# Patient Record
Sex: Female | Born: 1945 | Race: White | Hispanic: No | Marital: Single | Smoking: Current every day smoker
Health system: Southern US, Community
[De-identification: ages and names within clinical notes are randomized; demographics above are authoritative.]

## PROBLEM LIST (undated history)

## (undated) DIAGNOSIS — M1990 Unspecified osteoarthritis, unspecified site: Secondary | ICD-10-CM

## (undated) DIAGNOSIS — I499 Cardiac arrhythmia, unspecified: Secondary | ICD-10-CM

## (undated) DIAGNOSIS — M75102 Unspecified rotator cuff tear or rupture of left shoulder, not specified as traumatic: Secondary | ICD-10-CM

## (undated) DIAGNOSIS — F32A Depression, unspecified: Secondary | ICD-10-CM

## (undated) DIAGNOSIS — R0600 Dyspnea, unspecified: Secondary | ICD-10-CM

## (undated) DIAGNOSIS — I2510 Atherosclerotic heart disease of native coronary artery without angina pectoris: Secondary | ICD-10-CM

## (undated) DIAGNOSIS — K219 Gastro-esophageal reflux disease without esophagitis: Secondary | ICD-10-CM

## (undated) DIAGNOSIS — F329 Major depressive disorder, single episode, unspecified: Secondary | ICD-10-CM

## (undated) DIAGNOSIS — R011 Cardiac murmur, unspecified: Secondary | ICD-10-CM

## (undated) DIAGNOSIS — J449 Chronic obstructive pulmonary disease, unspecified: Secondary | ICD-10-CM

## (undated) DIAGNOSIS — F419 Anxiety disorder, unspecified: Secondary | ICD-10-CM

## (undated) DIAGNOSIS — E119 Type 2 diabetes mellitus without complications: Secondary | ICD-10-CM

## (undated) HISTORY — PX: EYE SURGERY: SHX253

## (undated) HISTORY — PX: OTHER SURGICAL HISTORY: SHX169

## (undated) HISTORY — PX: ABDOMINAL HYSTERECTOMY: SHX81

## (undated) HISTORY — PX: HERNIA REPAIR: SHX51

---

## 2000-12-05 HISTORY — PX: CORONARY ARTERY BYPASS GRAFT: SHX141

## 2006-08-11 DIAGNOSIS — E119 Type 2 diabetes mellitus without complications: Secondary | ICD-10-CM | POA: Insufficient documentation

## 2006-08-11 DIAGNOSIS — I2510 Atherosclerotic heart disease of native coronary artery without angina pectoris: Secondary | ICD-10-CM | POA: Insufficient documentation

## 2006-08-11 DIAGNOSIS — E7800 Pure hypercholesterolemia, unspecified: Secondary | ICD-10-CM | POA: Insufficient documentation

## 2006-08-24 DIAGNOSIS — E559 Vitamin D deficiency, unspecified: Secondary | ICD-10-CM | POA: Insufficient documentation

## 2016-11-21 ENCOUNTER — Ambulatory Visit (INDEPENDENT_AMBULATORY_CARE_PROVIDER_SITE_OTHER): Payer: Self-pay | Admitting: Orthopaedic Surgery

## 2017-09-11 ENCOUNTER — Ambulatory Visit (INDEPENDENT_AMBULATORY_CARE_PROVIDER_SITE_OTHER): Payer: Self-pay | Admitting: Orthopaedic Surgery

## 2017-09-11 ENCOUNTER — Ambulatory Visit (INDEPENDENT_AMBULATORY_CARE_PROVIDER_SITE_OTHER): Payer: Medicare Other | Admitting: Orthopaedic Surgery

## 2017-09-11 ENCOUNTER — Ambulatory Visit (INDEPENDENT_AMBULATORY_CARE_PROVIDER_SITE_OTHER): Payer: Medicare Other

## 2017-09-11 DIAGNOSIS — M25551 Pain in right hip: Secondary | ICD-10-CM | POA: Diagnosis not present

## 2017-09-11 DIAGNOSIS — M1611 Unilateral primary osteoarthritis, right hip: Secondary | ICD-10-CM

## 2017-09-11 NOTE — Progress Notes (Signed)
Office Visit Note   Patient: Becky Douglas           Date of Birth: May 22, 1946           MRN: [id number] Visit Date: 09/11/2017              Requested by: No referring provider defined for this encounter. PCP: No primary care provider on file.   Assessment & Plan: Visit Diagnoses:  1. Pain in right hip   2. Unilateral primary osteoarthritis, right hip     Plan: She does have severe end-stage arthritis of her right hip and is felt conservative treatment for over a year now. She does wish proceed with total hip arthroplasty. I explained in detail what this surgery involves. Her daughter is with her and I have actually performed a hip replacement on her right hip a year and half ago and she did excellent with this. Her mother's pain is daily at this point is severe enough and this correlates with x-ray showing severe end-stage arthritis of her right hip. We will work on getting a total hip arthroplasty set up in the near future. We long and thorough discussion about what the surgery involves including a discussion of the risk and benefits of the surgery. We talked about her intraoperative and postoperative courses well. All questions and concerns were answered and addressed. We'll work on setting this surgery up in the near future.  Follow-Up Instructions: Return for 2 weeks post-op.   Orders:  Orders Placed This Encounter  Procedures  . XR HIP UNILAT W OR W/O PELVIS 1V RIGHT   No orders of the defined types were placed in this encounter.     Procedures: No procedures performed   Clinical Data: No additional findings.   Subjective: No chief complaint on file. The patient is a very pleasant 71 year old female with debilitating right hip pain. This pain is been worsening for over a year now. It hurts mainly in the groin. It hurts with weightbearing activities. He can be 10 out of 10 at times. At this point it is detrimentally affected her activities daily living, her quality of  life, and her mobility. She has tried and failed rest, ice, heat, anti-inflammatories, weight loss, walking with assistive device, and a home exercise program. She is work on hip strengthening exercises well. At this point her pain is severe enough that she is seeking treatment for this. She has tried Motrin 800 mg as well. She does have a history of open heart surgery but is been seen by her cardiologist in the past who tells her everything looks good and have her normal annual follow-ups. She has follow up soon. She is a diabetic but reports excellent control of her blood glucose.  HPI  Review of Systems She currently denies any headache, chest pain, shortness of breath, fever, chills, nausea, vomiting.  Objective: Vital Signs: There were no vitals taken for this visit.  Physical Exam She is alert and oriented 3 and in no acute distress. Ortho Exam Examination of her left non-painful hip is normal. Examination of her right hip shows limitations with internal and external rotation with severe pain with attempts of this rotation. Specialty Comments:  No specialty comments available.  Imaging: Xr Hip Unilat W Or W/o Pelvis 1v Right  Result Date: 09/11/2017 An AP pelvis and lateral of her right hip show severe end-stage arthritis. There is complete loss of the joint space of the right hip with periarticular osteophytes, sclerotic changes and  cystic changes in the femoral head.    PMFS History: Patient Active Problem List   Diagnosis Date Noted  . Pain in right hip 09/11/2017  . Unilateral primary osteoarthritis, right hip 09/11/2017   No past medical history on file.  No family history on file.  No past surgical history on file. Social History   Occupational History  . Not on file.   Social History Main Topics  . Smoking status: Not on file  . Smokeless tobacco: Not on file  . Alcohol use Not on file  . Drug use: Unknown  . Sexual activity: Not on file

## 2017-09-20 NOTE — Progress Notes (Signed)
Please place orders in EPIC as patient is being scheduled for a pre-op appointment! Thank you! 

## 2017-09-25 NOTE — Progress Notes (Signed)
Please place orders in EPIC as patient has a pre-op appointment on 10/02/2017! Thank you! 

## 2017-09-27 NOTE — Progress Notes (Signed)
Need orders in epic.  Surgery on 10/06/17.  preop on 10/02/17.

## 2017-09-28 NOTE — Patient Instructions (Addendum)
Becky Douglas  09/28/2017   Your procedure is scheduled on: 10/06/2017    Report to [address] [facility] Main  Entrance   Report to admitting at   1030 AM   Call this number if you have problems the morning of surgery  [phone]   Remember: ONLY 1 PERSON MAY GO WITH YOU TO SHORT STAY TO GET  READY MORNING OF YOUR SURGERY.  Do not eat food or drink liquids :After Midnight.     Take these medicines the morning of surgery with A SIP OF WATER: Atenolol ( Tenormin0, Inhalers as usual and bring, paxil  DO NOT TAKE ANY DIABETIC MEDICATIONS DAY OF YOUR SURGERY                               You may not have any metal on your body including hair pins and              piercings  Do not wear jewelry, make-up, lotions, powders or perfumes, deodorant             Do not wear nail polish.  Do not shave  48 hours prior to surgery.                Do not bring valuables to the hospital. [facility] IS NOT             RESPONSIBLE   FOR VALUABLES.  Contacts, dentures or bridgework may not be worn into surgery.  Leave suitcase in the car. After surgery it may be brought to your room.                     Please read over the following fact sheets you were given: _____________________________________________________________________                CLEAR LIQUID DIET   Foods Allowed                                                                     Foods Excluded  Coffee and tea, regular and decaf                             liquids that you cannot  Plain Jell-O in any flavor                                             see through such as: Fruit ices (not with fruit pulp)                                     milk, soups, orange juice  Iced Popsicles                                    All solid food Carbonated beverages, regular  and diet                                    Cranberry, grape and apple juices Sports drinks like Gatorade Lightly seasoned clear broth or  consume(fat free) Sugar, honey syrup  Sample Menu Breakfast                                Lunch                                     Supper Cranberry juice                    Beef broth                            Chicken broth Jell-O                                     Grape juice                           Apple juice Coffee or tea                        Jell-O                                      Popsicle                                                Coffee or tea                        Coffee or tea  _____________________________________________________________________  [facility] - Preparing for Surgery Before surgery, you can play an important role.  Because skin is not sterile, your skin needs to be as free of germs as possible.  You can reduce the number of germs on your skin by washing with CHG (chlorahexidine gluconate) soap before surgery.  CHG is an antiseptic cleaner which kills germs and bonds with the skin to continue killing germs even after washing. Please DO NOT use if you have an allergy to CHG or antibacterial soaps.  If your skin becomes reddened/irritated stop using the CHG and inform your nurse when you arrive at Short Stay. Do not shave (including legs and underarms) for at least 48 hours prior to the first CHG shower.  You may shave your face/neck. Please follow these instructions carefully:  1.  Shower with CHG Soap the night before surgery and the  morning of Surgery.  2.  If you choose to wash your hair, wash your hair first as usual with your  normal  shampoo.  3.  After you shampoo, rinse your hair and body thoroughly to remove the  shampoo.  4.  Use CHG as you would any other liquid soap.  You can apply chg directly  to the skin and wash                       Gently with a scrungie or clean washcloth.  5.  Apply the CHG Soap to your body ONLY FROM THE NECK DOWN.   Do not use on face/ open                           Wound or open sores.  Avoid contact with eyes, ears mouth and genitals (private parts).                       Wash face,  Genitals (private parts) with your normal soap.             6.  Wash thoroughly, paying special attention to the area where your surgery  will be performed.  7.  Thoroughly rinse your body with warm water from the neck down.  8.  DO NOT shower/wash with your normal soap after using and rinsing off  the CHG Soap.                9.  Pat yourself dry with a clean towel.            10.  Wear clean pajamas.            11.  Place clean sheets on your bed the night of your first shower and do not  sleep with pets. Day of Surgery : Do not apply any lotions/deodorants the morning of surgery.  Please wear clean clothes to the hospital/surgery center.  FAILURE TO FOLLOW THESE INSTRUCTIONS MAY RESULT IN THE CANCELLATION OF YOUR SURGERY PATIENT SIGNATURE_________________________________  NURSE SIGNATURE__________________________________  ________________________________________________________________________  WHAT IS A BLOOD TRANSFUSION? Blood Transfusion Information  A transfusion is the replacement of blood or some of its parts. Blood is made up of multiple cells which provide different functions.  Red blood cells carry oxygen and are used for blood loss replacement.  White blood cells fight against infection.  Platelets control bleeding.  Plasma helps clot blood.  Other blood products are available for specialized needs, such as hemophilia or other clotting disorders. BEFORE THE TRANSFUSION  Who gives blood for transfusions?   Healthy volunteers who are fully evaluated to make sure their blood is safe. This is blood bank blood. Transfusion therapy is the safest it has ever been in the practice of medicine. Before blood is taken from a donor, a complete history is taken to make sure that person has no history of diseases nor engages in risky social behavior (examples are intravenous drug use  or sexual activity with multiple partners). The donor's travel history is screened to minimize risk of transmitting infections, such as malaria. The donated blood is tested for signs of infectious diseases, such as HIV and hepatitis. The blood is then tested to be sure it is compatible with you in order to minimize the chance of a transfusion reaction. If you or a relative donates blood, this is often done in anticipation of surgery and is not appropriate for emergency situations. It takes many days to process the donated blood. RISKS AND COMPLICATIONS Although transfusion therapy is very safe and saves many lives, the main dangers of transfusion include:   Getting an infectious disease.  Developing a transfusion reaction.  This is an allergic reaction to something in the blood you were given. Every precaution is taken to prevent this. The decision to have a blood transfusion has been considered carefully by your caregiver before blood is given. Blood is not given unless the benefits outweigh the risks. AFTER THE TRANSFUSION  Right after receiving a blood transfusion, you will usually feel much better and more energetic. This is especially true if your red blood cells have gotten low (anemic). The transfusion raises the level of the red blood cells which carry oxygen, and this usually causes an energy increase.  The nurse administering the transfusion will monitor you carefully for complications. HOME CARE INSTRUCTIONS  No special instructions are needed after a transfusion. You may find your energy is better. Speak with your caregiver about any limitations on activity for underlying diseases you may have. SEEK MEDICAL CARE IF:   Your condition is not improving after your transfusion.  You develop redness or irritation at the intravenous (IV) site. SEEK IMMEDIATE MEDICAL CARE IF:  Any of the following symptoms occur over the next 12 hours:  Shaking chills.  You have a temperature by mouth  above 102 F (38.9 C), not controlled by medicine.  Chest, back, or muscle pain.  People around you feel you are not acting correctly or are confused.  Shortness of breath or difficulty breathing.  Dizziness and fainting.  You get a rash or develop hives.  You have a decrease in urine output.  Your urine turns a dark color or changes to pink, red, or brown. Any of the following symptoms occur over the next 10 days:  You have a temperature by mouth above 102 F (38.9 C), not controlled by medicine.  Shortness of breath.  Weakness after normal activity.  The white part of the eye turns yellow (jaundice).  You have a decrease in the amount of urine or are urinating less often.  Your urine turns a dark color or changes to pink, red, or brown. Document Released: 11/18/2000 Document Revised: 02/13/2012 Document Reviewed: 07/07/2008 ExitCare Patient Information 2014 [place], [place].  _______________________________________________________________________  Incentive Spirometer  An incentive spirometer is a tool that can help keep your lungs clear and active. This tool measures how well you are filling your lungs with each breath. Taking long deep breaths may help reverse or decrease the chance of developing breathing (pulmonary) problems (especially infection) following:  A long period of time when you are unable to move or be active. BEFORE THE PROCEDURE   If the spirometer includes an indicator to show your best effort, your nurse or respiratory therapist will set it to a desired goal.  If possible, sit up straight or lean slightly forward. Try not to slouch.  Hold the incentive spirometer in an upright position. INSTRUCTIONS FOR USE  1. Sit on the edge of your bed if possible, or sit up as far as you can in bed or on a chair. 2. Hold the incentive spirometer in an upright position. 3. Breathe out normally. 4. Place the mouthpiece in your mouth and seal your lips tightly  around it. 5. Breathe in slowly and as deeply as possible, raising the piston or the ball toward the top of the column. 6. Hold your breath for 3-5 seconds or for as long as possible. Allow the piston or ball to fall to the bottom of the column. 7. Remove the mouthpiece from your mouth and breathe out normally. 8. Rest for a few seconds and repeat Steps  1 through 7 at least 10 times every 1-2 hours when you are awake. Take your time and take a few normal breaths between deep breaths. 9. The spirometer may include an indicator to show your best effort. Use the indicator as a goal to work toward during each repetition. 10. After each set of 10 deep breaths, practice coughing to be sure your lungs are clear. If you have an incision (the cut made at the time of surgery), support your incision when coughing by placing a pillow or rolled up towels firmly against it. Once you are able to get out of bed, walk around indoors and cough well. You may stop using the incentive spirometer when instructed by your caregiver.  RISKS AND COMPLICATIONS  Take your time so you do not get dizzy or light-headed.  If you are in pain, you may need to take or ask for pain medication before doing incentive spirometry. It is harder to take a deep breath if you are having pain. AFTER USE  Rest and breathe slowly and easily.  It can be helpful to keep track of a log of your progress. Your caregiver can provide you with a simple table to help with this. If you are using the spirometer at home, follow these instructions: [place] IF:   You are having difficultly using the spirometer.  You have trouble using the spirometer as often as instructed.  Your pain medication is not giving enough relief while using the spirometer.  You develop fever of 100.5 F (38.1 C) or higher. SEEK IMMEDIATE MEDICAL CARE IF:   You cough up bloody sputum that had not been present before.  You develop fever of 102 F (38.9 C) or  greater.  You develop worsening pain at or near the incision site. MAKE SURE YOU:   Understand these instructions.  Will watch your condition.  Will get help right away if you are not doing well or get worse. Document Released: 04/03/2007 Document Revised: 02/13/2012 Document Reviewed: 06/04/2007 [facility] Patient Information 2014 [place], [place].   ________________________________________________________________________

## 2017-09-28 NOTE — Progress Notes (Signed)
Please place orders in EPIC as patient has a pre-op appointment on 10/02/2017! Thank you! 

## 2017-09-29 ENCOUNTER — Other Ambulatory Visit (INDEPENDENT_AMBULATORY_CARE_PROVIDER_SITE_OTHER): Payer: Self-pay | Admitting: Physician Assistant

## 2017-09-29 NOTE — Progress Notes (Signed)
Please place orders in EPIC as patient has a pre-op appointment on 10/02/2017! Thank you! 

## 2017-10-02 ENCOUNTER — Encounter (HOSPITAL_COMMUNITY): Payer: Self-pay | Admitting: *Deleted

## 2017-10-02 ENCOUNTER — Encounter (HOSPITAL_COMMUNITY)
Admission: RE | Admit: 2017-10-02 | Discharge: 2017-10-02 | Disposition: A | Discharge status: Home / Self Care | Payer: Medicare Other | Source: Ambulatory Visit | Attending: Orthopaedic Surgery | Admitting: Orthopaedic Surgery

## 2017-10-02 DIAGNOSIS — Z01818 Encounter for other preprocedural examination: Secondary | ICD-10-CM | POA: Insufficient documentation

## 2017-10-02 DIAGNOSIS — M1611 Unilateral primary osteoarthritis, right hip: Secondary | ICD-10-CM | POA: Diagnosis not present

## 2017-10-02 HISTORY — DX: Gastro-esophageal reflux disease without esophagitis: K21.9

## 2017-10-02 HISTORY — DX: Chronic obstructive pulmonary disease, unspecified: J44.9

## 2017-10-02 HISTORY — DX: Anxiety disorder, unspecified: F41.9

## 2017-10-02 HISTORY — DX: Cardiac arrhythmia, unspecified: I49.9

## 2017-10-02 HISTORY — DX: Dyspnea, unspecified: R06.00

## 2017-10-02 HISTORY — DX: Depression, unspecified: F32.A

## 2017-10-02 HISTORY — DX: Type 2 diabetes mellitus without complications: E11.9

## 2017-10-02 HISTORY — DX: Unspecified osteoarthritis, unspecified site: M19.90

## 2017-10-02 HISTORY — DX: Unspecified rotator cuff tear or rupture of left shoulder, not specified as traumatic: M75.102

## 2017-10-02 HISTORY — DX: Atherosclerotic heart disease of native coronary artery without angina pectoris: I25.10

## 2017-10-02 HISTORY — DX: Major depressive disorder, single episode, unspecified: F32.9

## 2017-10-02 HISTORY — DX: Cardiac murmur, unspecified: R01.1

## 2017-10-02 LAB — BASIC METABOLIC PANEL
ANION GAP: 8 (ref 5–15)
BUN: 14 mg/dL (ref 6–20)
CHLORIDE: 104 mmol/L (ref 101–111)
CO2: 29 mmol/L (ref 22–32)
Calcium: 9.4 mg/dL (ref 8.9–10.3)
Creatinine, Ser: 0.94 mg/dL (ref 0.44–1.00)
GFR calc Af Amer: >60 mL/min (ref >60)
GFR calc non Af Amer: >60 mL/min (ref >60)
GLUCOSE: 76 mg/dL (ref 65–99)
POTASSIUM: 4.8 mmol/L (ref 3.5–5.1)
Sodium: 141 mmol/L (ref 135–145)

## 2017-10-02 LAB — HEMOGLOBIN A1C
HEMOGLOBIN A1C: 7.4 % — AB (ref 4.8–5.6)
Mean Plasma Glucose: 165.68 mg/dL

## 2017-10-02 LAB — SURGICAL PCR SCREEN
MRSA, PCR: NEGATIVE
STAPHYLOCOCCUS AUREUS: NEGATIVE

## 2017-10-02 LAB — CBC
HEMATOCRIT: 40.6 % (ref 36.0–46.0)
Hemoglobin: 12.3 g/dL (ref 12.0–15.0)
MCH: 27 pg (ref 26.0–34.0)
MCHC: 30.3 g/dL (ref 30.0–36.0)
MCV: 89.2 fL (ref 78.0–100.0)
PLATELETS: 423 10*3/uL — AB (ref 150–400)
RBC: 4.55 MIL/uL (ref 3.87–5.11)
RDW: 14.5 % (ref 11.5–15.5)
WBC: 12.3 10*3/uL — AB (ref 4.0–10.5)

## 2017-10-02 LAB — GLUCOSE, CAPILLARY: GLUCOSE-CAPILLARY: 80 mg/dL (ref 65–99)

## 2017-10-02 LAB — ABO/RH: ABO/RH(D): A POS

## 2017-10-02 NOTE — Progress Notes (Signed)
10/11/16-Dr Sheliah HatchWarner- cardiology- LOV- epic  ECHO-2015 on chart  10/11/16-EKG on chart

## 2017-10-02 NOTE — Progress Notes (Signed)
CBC done 10/02/17 routed via epic to Dr Allie Bossierhris Blackman.

## 2017-10-03 NOTE — Progress Notes (Signed)
Final EKG done 10/02/17-epic  

## 2017-10-05 NOTE — Progress Notes (Signed)
Requesting any clearances you may have received from pulmonary ( Dr Beatrix Fettersavid Smith0  and/or cardiology ( Dr Sheliah HatchWarner)  on this patient. Thank You.

## 2017-10-05 NOTE — Progress Notes (Signed)
DR Michelle Piperssey  ( anesthesia) made aware of patient history along with LOV note from 10/03/17- pulmonary on chart Ct chest( 10/8/180 on chart and history of cabg.  Dr Michelle Piperssey reviewed and order given to call Felicie Mornavid Smith PA and to inform that patient having right hip on 10/06/17 which I did and that surgery would be under spinal and for him to send over any recommendations he would have.  Called office of Felicie Mornavid Smith, [place] and spoke with Leandro Reasoneroxanne and she will give message to Felicie Mornavid Smith and either have him call me or fax over any recommendations for surgery.

## 2017-10-05 NOTE — Progress Notes (Signed)
Roxanne called back from office of Felicie MornDavid Smith ( pulmonology) and stated per Felicie Mornavid Smith, PA that other than patient needs oxygen 24/7 there are no other recommendations since patient is receiving spinal for surgery.

## 2017-10-05 NOTE — Progress Notes (Signed)
Requested LOV note from 10/03/2017 since was not completed on 10/04/17 from pulmonology.  They are to fax.

## 2017-10-05 NOTE — Progress Notes (Signed)
Rerequested LOV note from Dr Felicie Mornavid Smith ( pulm). Office to send.

## 2017-10-05 NOTE — Progress Notes (Signed)
CT of chest done 09/12/2017 on chart.

## 2017-10-06 ENCOUNTER — Encounter (HOSPITAL_COMMUNITY): Payer: Self-pay

## 2017-10-06 ENCOUNTER — Encounter (HOSPITAL_COMMUNITY)
Admission: RE | Disposition: A | Discharge status: Skilled Nursing Facility | Payer: Self-pay | Source: Ambulatory Visit | Attending: Orthopaedic Surgery

## 2017-10-06 ENCOUNTER — Inpatient Hospital Stay (HOSPITAL_COMMUNITY)
Admission: RE | Admit: 2017-10-06 | Discharge: 2017-10-09 | DRG: 470 | Disposition: A | Discharge status: Skilled Nursing Facility | Payer: Medicare Other | Source: Ambulatory Visit | Attending: Orthopaedic Surgery | Admitting: Orthopaedic Surgery

## 2017-10-06 ENCOUNTER — Inpatient Hospital Stay (HOSPITAL_COMMUNITY): Payer: Medicare Other | Admitting: Certified Registered Nurse Anesthetist

## 2017-10-06 ENCOUNTER — Inpatient Hospital Stay (HOSPITAL_COMMUNITY): Payer: Medicare Other

## 2017-10-06 ENCOUNTER — Telehealth (HOSPITAL_COMMUNITY): Payer: Self-pay | Admitting: *Deleted

## 2017-10-06 DIAGNOSIS — J449 Chronic obstructive pulmonary disease, unspecified: Secondary | ICD-10-CM | POA: Diagnosis present

## 2017-10-06 DIAGNOSIS — R410 Disorientation, unspecified: Secondary | ICD-10-CM | POA: Diagnosis present

## 2017-10-06 DIAGNOSIS — M1611 Unilateral primary osteoarthritis, right hip: Principal | ICD-10-CM | POA: Diagnosis present

## 2017-10-06 DIAGNOSIS — Z951 Presence of aortocoronary bypass graft: Secondary | ICD-10-CM | POA: Diagnosis not present

## 2017-10-06 DIAGNOSIS — K219 Gastro-esophageal reflux disease without esophagitis: Secondary | ICD-10-CM | POA: Diagnosis present

## 2017-10-06 DIAGNOSIS — E669 Obesity, unspecified: Secondary | ICD-10-CM | POA: Diagnosis present

## 2017-10-06 DIAGNOSIS — I2510 Atherosclerotic heart disease of native coronary artery without angina pectoris: Secondary | ICD-10-CM | POA: Diagnosis present

## 2017-10-06 DIAGNOSIS — Z419 Encounter for procedure for purposes other than remedying health state, unspecified: Secondary | ICD-10-CM

## 2017-10-06 DIAGNOSIS — F17210 Nicotine dependence, cigarettes, uncomplicated: Secondary | ICD-10-CM | POA: Diagnosis present

## 2017-10-06 DIAGNOSIS — Z6835 Body mass index (BMI) 35.0-35.9, adult: Secondary | ICD-10-CM | POA: Diagnosis not present

## 2017-10-06 DIAGNOSIS — Z96641 Presence of right artificial hip joint: Secondary | ICD-10-CM

## 2017-10-06 DIAGNOSIS — E119 Type 2 diabetes mellitus without complications: Secondary | ICD-10-CM | POA: Diagnosis present

## 2017-10-06 HISTORY — PX: TOTAL HIP ARTHROPLASTY: SHX124

## 2017-10-06 LAB — GLUCOSE, CAPILLARY
GLUCOSE-CAPILLARY: 250 mg/dL — AB (ref 65–99)
Glucose-Capillary: 149 mg/dL — ABNORMAL HIGH (ref 65–99)
Glucose-Capillary: 90 mg/dL (ref 65–99)
Glucose-Capillary: 313 mg/dL — ABNORMAL HIGH (ref 65–99)

## 2017-10-06 LAB — TYPE AND SCREEN
ABO/RH(D): A POS
Antibody Screen: NEGATIVE

## 2017-10-06 SURGERY — ARTHROPLASTY, HIP, TOTAL, ANTERIOR APPROACH
Anesthesia: Spinal | Site: Hip | Laterality: Right

## 2017-10-06 MED ORDER — ALBUTEROL SULFATE (2.5 MG/3ML) 0.083% IN NEBU
2.5000 mg | INHALATION_SOLUTION | Freq: Once | RESPIRATORY_TRACT | Status: AC
  Administered 2017-10-06: 2.5 mg via RESPIRATORY_TRACT

## 2017-10-06 MED ORDER — ALPRAZOLAM 1 MG PO TABS
1.0000 mg | ORAL_TABLET | Freq: Every day | ORAL | Status: DC
  Administered 2017-10-06 – 2017-10-08 (×2): 1 mg via ORAL
  Filled 2017-10-06 (×2): qty 1

## 2017-10-06 MED ORDER — CEFAZOLIN SODIUM-DEXTROSE 2-4 GM/100ML-% IV SOLN
2.0000 g | INTRAVENOUS | Status: AC
  Administered 2017-10-06: 2 g via INTRAVENOUS

## 2017-10-06 MED ORDER — BENAZEPRIL HCL 10 MG PO TABS
10.0000 mg | ORAL_TABLET | Freq: Every day | ORAL | Status: DC
  Administered 2017-10-07 – 2017-10-09 (×3): 10 mg via ORAL
  Filled 2017-10-06 (×3): qty 1

## 2017-10-06 MED ORDER — PAROXETINE HCL 10 MG PO TABS
10.0000 mg | ORAL_TABLET | Freq: Every day | ORAL | Status: DC
  Administered 2017-10-06 – 2017-10-09 (×4): 10 mg via ORAL
  Filled 2017-10-06 (×4): qty 1

## 2017-10-06 MED ORDER — HYDROMORPHONE HCL 1 MG/ML IJ SOLN
1.0000 mg | INTRAMUSCULAR | Status: DC | PRN
  Administered 2017-10-06 – 2017-10-07 (×2): 1 mg via INTRAVENOUS
  Filled 2017-10-06: qty 1

## 2017-10-06 MED ORDER — CEFAZOLIN SODIUM-DEXTROSE 2-4 GM/100ML-% IV SOLN
INTRAVENOUS | Status: AC
  Filled 2017-10-06: qty 100

## 2017-10-06 MED ORDER — UMECLIDINIUM BROMIDE 62.5 MCG/INH IN AEPB
1.0000 | INHALATION_SPRAY | Freq: Every day | RESPIRATORY_TRACT | Status: DC
  Administered 2017-10-07 – 2017-10-09 (×3): 1 via RESPIRATORY_TRACT
  Filled 2017-10-06: qty 7

## 2017-10-06 MED ORDER — ACETAMINOPHEN 325 MG PO TABS
650.0000 mg | ORAL_TABLET | ORAL | Status: DC | PRN
Start: 2017-10-06 — End: 2017-10-09
  Administered 2017-10-08 – 2017-10-09 (×2): 650 mg via ORAL
  Filled 2017-10-06 (×2): qty 2

## 2017-10-06 MED ORDER — MIDAZOLAM HCL 2 MG/2ML IJ SOLN
INTRAMUSCULAR | Status: AC
  Filled 2017-10-06: qty 2

## 2017-10-06 MED ORDER — METFORMIN HCL 500 MG PO TABS
1000.0000 mg | ORAL_TABLET | Freq: Two times a day (BID) | ORAL | Status: DC
  Administered 2017-10-06 – 2017-10-09 (×6): 1000 mg via ORAL
  Filled 2017-10-06 (×6): qty 2

## 2017-10-06 MED ORDER — TRANEXAMIC ACID 1000 MG/10ML IV SOLN
1000.0000 mg | INTRAVENOUS | Status: AC
  Administered 2017-10-06: 1000 mg via INTRAVENOUS
  Filled 2017-10-06: qty 1100

## 2017-10-06 MED ORDER — GABAPENTIN 100 MG PO CAPS
100.0000 mg | ORAL_CAPSULE | Freq: Three times a day (TID) | ORAL | Status: DC
  Administered 2017-10-06 – 2017-10-09 (×9): 100 mg via ORAL
  Filled 2017-10-06 (×9): qty 1

## 2017-10-06 MED ORDER — ALUM & MAG HYDROXIDE-SIMETH 200-200-20 MG/5ML PO SUSP: 30.0000 mL | ORAL | Status: DC | PRN

## 2017-10-06 MED ORDER — FENTANYL CITRATE (PF) 100 MCG/2ML IJ SOLN
INTRAMUSCULAR | Status: DC | PRN
  Administered 2017-10-06: 50 ug via INTRAVENOUS

## 2017-10-06 MED ORDER — MENTHOL 3 MG MT LOZG
1.0000 | LOZENGE | OROMUCOSAL | Status: DC | PRN
Start: 2017-10-06 — End: 2017-10-09

## 2017-10-06 MED ORDER — PROPOFOL 10 MG/ML IV BOLUS
INTRAVENOUS | Status: AC
  Filled 2017-10-06: qty 60

## 2017-10-06 MED ORDER — FLUTICASONE-UMECLIDIN-VILANT 100-62.5-25 MCG/INH IN AEPB: 1.0000 | INHALATION_SPRAY | Freq: Every day | RESPIRATORY_TRACT | Status: DC

## 2017-10-06 MED ORDER — DIPHENHYDRAMINE HCL 12.5 MG/5ML PO ELIX: 12.5000 mg | ORAL_SOLUTION | ORAL | Status: DC | PRN

## 2017-10-06 MED ORDER — PROMETHAZINE HCL 25 MG/ML IJ SOLN: 6.2500 mg | INTRAMUSCULAR | Status: DC | PRN

## 2017-10-06 MED ORDER — DEXAMETHASONE SODIUM PHOSPHATE 10 MG/ML IJ SOLN
INTRAMUSCULAR | Status: AC
  Filled 2017-10-06: qty 1

## 2017-10-06 MED ORDER — MIDAZOLAM HCL 5 MG/5ML IJ SOLN
INTRAMUSCULAR | Status: DC | PRN
  Administered 2017-10-06: 1 mg via INTRAVENOUS

## 2017-10-06 MED ORDER — CHLORHEXIDINE GLUCONATE 4 % EX LIQD: 60.0000 mL | Freq: Once | CUTANEOUS | Status: DC

## 2017-10-06 MED ORDER — BUPIVACAINE IN DEXTROSE 0.75-8.25 % IT SOLN
INTRATHECAL | Status: DC | PRN
  Administered 2017-10-06: 2 mL via INTRATHECAL

## 2017-10-06 MED ORDER — DOCUSATE SODIUM 100 MG PO CAPS
100.0000 mg | ORAL_CAPSULE | Freq: Two times a day (BID) | ORAL | Status: DC
  Administered 2017-10-06 – 2017-10-09 (×6): 100 mg via ORAL
  Filled 2017-10-06 (×6): qty 1

## 2017-10-06 MED ORDER — POLYETHYLENE GLYCOL 3350 17 G PO PACK: 17.0000 g | PACK | Freq: Every day | ORAL | Status: DC | PRN

## 2017-10-06 MED ORDER — METOCLOPRAMIDE HCL 5 MG/ML IJ SOLN: 5.0000 mg | Freq: Three times a day (TID) | INTRAMUSCULAR | Status: DC | PRN

## 2017-10-06 MED ORDER — ONDANSETRON HCL 4 MG/2ML IJ SOLN
INTRAMUSCULAR | Status: DC | PRN
  Administered 2017-10-06: 4 mg via INTRAVENOUS

## 2017-10-06 MED ORDER — INSULIN ASPART 100 UNIT/ML ~~LOC~~ SOLN
0.0000 [IU] | Freq: Three times a day (TID) | SUBCUTANEOUS | Status: DC
  Administered 2017-10-06: 11 [IU] via SUBCUTANEOUS
  Administered 2017-10-07 (×2): 2 [IU] via SUBCUTANEOUS
  Administered 2017-10-08: 3 [IU] via SUBCUTANEOUS

## 2017-10-06 MED ORDER — CEFAZOLIN SODIUM-DEXTROSE 1-4 GM/50ML-% IV SOLN
1.0000 g | Freq: Four times a day (QID) | INTRAVENOUS | Status: AC
  Administered 2017-10-06 (×2): 1 g via INTRAVENOUS
  Filled 2017-10-06 (×2): qty 50

## 2017-10-06 MED ORDER — INSULIN ASPART 100 UNIT/ML ~~LOC~~ SOLN
0.0000 [IU] | Freq: Every day | SUBCUTANEOUS | Status: DC
  Administered 2017-10-06: 2 [IU] via SUBCUTANEOUS

## 2017-10-06 MED ORDER — PHENOL 1.4 % MT LIQD: 1.0000 | OROMUCOSAL | Status: DC | PRN

## 2017-10-06 MED ORDER — FLUTICASONE FUROATE-VILANTEROL 200-25 MCG/INH IN AEPB
1.0000 | INHALATION_SPRAY | Freq: Every day | RESPIRATORY_TRACT | Status: DC
  Administered 2017-10-07 – 2017-10-09 (×3): 1 via RESPIRATORY_TRACT
  Filled 2017-10-06: qty 28

## 2017-10-06 MED ORDER — ACETAMINOPHEN 650 MG RE SUPP
650.0000 mg | RECTAL | Status: DC | PRN
Start: 2017-10-06 — End: 2017-10-09

## 2017-10-06 MED ORDER — PHENYLEPHRINE HCL 10 MG/ML IJ SOLN
INTRAVENOUS | Status: DC | PRN
  Administered 2017-10-06: 50 ug/min via INTRAVENOUS

## 2017-10-06 MED ORDER — METHOCARBAMOL 500 MG PO TABS
500.0000 mg | ORAL_TABLET | Freq: Four times a day (QID) | ORAL | Status: DC | PRN
  Administered 2017-10-07 – 2017-10-08 (×3): 500 mg via ORAL
  Filled 2017-10-06 (×3): qty 1

## 2017-10-06 MED ORDER — HYDROMORPHONE HCL 1 MG/ML IJ SOLN: 0.2500 mg | INTRAMUSCULAR | Status: DC | PRN

## 2017-10-06 MED ORDER — PANTOPRAZOLE SODIUM 40 MG PO TBEC
40.0000 mg | DELAYED_RELEASE_TABLET | Freq: Every day | ORAL | Status: DC
  Administered 2017-10-06 – 2017-10-08 (×3): 40 mg via ORAL
  Filled 2017-10-06 (×3): qty 1

## 2017-10-06 MED ORDER — ATENOLOL 50 MG PO TABS
50.0000 mg | ORAL_TABLET | Freq: Every day | ORAL | Status: DC
  Administered 2017-10-07 – 2017-10-09 (×3): 50 mg via ORAL
  Filled 2017-10-06 (×3): qty 1

## 2017-10-06 MED ORDER — SODIUM CHLORIDE 0.9 % IR SOLN
Status: DC | PRN
  Administered 2017-10-06: 1000 mL

## 2017-10-06 MED ORDER — HYDROCODONE-ACETAMINOPHEN 5-325 MG PO TABS
1.0000 | ORAL_TABLET | ORAL | Status: DC | PRN
  Administered 2017-10-08: 1 via ORAL
  Filled 2017-10-06: qty 1

## 2017-10-06 MED ORDER — OXYCODONE HCL 5 MG PO TABS
5.0000 mg | ORAL_TABLET | ORAL | Status: DC | PRN
  Administered 2017-10-06 (×2): 5 mg via ORAL
  Administered 2017-10-07 – 2017-10-08 (×5): 10 mg via ORAL
  Filled 2017-10-06 (×3): qty 2
  Filled 2017-10-06: qty 1
  Filled 2017-10-06 (×2): qty 2
  Filled 2017-10-06: qty 1

## 2017-10-06 MED ORDER — ASPIRIN 81 MG PO CHEW
81.0000 mg | CHEWABLE_TABLET | Freq: Two times a day (BID) | ORAL | Status: DC
  Administered 2017-10-06 – 2017-10-09 (×6): 81 mg via ORAL
  Filled 2017-10-06 (×6): qty 1

## 2017-10-06 MED ORDER — PROPOFOL 500 MG/50ML IV EMUL
INTRAVENOUS | Status: DC | PRN
  Administered 2017-10-06: 30 ug/kg/min via INTRAVENOUS

## 2017-10-06 MED ORDER — LACTATED RINGERS IV SOLN
INTRAVENOUS | Status: DC
  Administered 2017-10-06: 1000 mL via INTRAVENOUS
  Administered 2017-10-06: 11:00:00 via INTRAVENOUS

## 2017-10-06 MED ORDER — METHOCARBAMOL 1000 MG/10ML IJ SOLN
500.0000 mg | Freq: Four times a day (QID) | INTRAVENOUS | Status: DC | PRN
  Administered 2017-10-06: 500 mg via INTRAVENOUS
  Filled 2017-10-06: qty 550

## 2017-10-06 MED ORDER — DEXAMETHASONE SODIUM PHOSPHATE 10 MG/ML IJ SOLN
INTRAMUSCULAR | Status: DC | PRN
  Administered 2017-10-06: 10 mg via INTRAVENOUS

## 2017-10-06 MED ORDER — ONDANSETRON HCL 4 MG PO TABS: 4.0000 mg | ORAL_TABLET | Freq: Four times a day (QID) | ORAL | Status: DC | PRN

## 2017-10-06 MED ORDER — GLIMEPIRIDE 4 MG PO TABS
4.0000 mg | ORAL_TABLET | Freq: Every day | ORAL | Status: DC
  Administered 2017-10-07 – 2017-10-09 (×3): 4 mg via ORAL
  Filled 2017-10-06 (×3): qty 1

## 2017-10-06 MED ORDER — FENTANYL CITRATE (PF) 100 MCG/2ML IJ SOLN
INTRAMUSCULAR | Status: AC
  Filled 2017-10-06: qty 2

## 2017-10-06 MED ORDER — NITROGLYCERIN 0.4 MG SL SUBL: 0.4000 mg | SUBLINGUAL_TABLET | SUBLINGUAL | Status: DC | PRN

## 2017-10-06 MED ORDER — FENOFIBRATE 160 MG PO TABS
160.0000 mg | ORAL_TABLET | Freq: Every day | ORAL | Status: DC
  Administered 2017-10-07 – 2017-10-09 (×3): 160 mg via ORAL
  Filled 2017-10-06 (×3): qty 1

## 2017-10-06 MED ORDER — ONDANSETRON HCL 4 MG/2ML IJ SOLN: 4.0000 mg | Freq: Four times a day (QID) | INTRAMUSCULAR | Status: DC | PRN

## 2017-10-06 MED ORDER — ATORVASTATIN CALCIUM 40 MG PO TABS
80.0000 mg | ORAL_TABLET | Freq: Every day | ORAL | Status: DC
  Administered 2017-10-07 – 2017-10-09 (×3): 80 mg via ORAL
  Filled 2017-10-06 (×3): qty 2

## 2017-10-06 MED ORDER — SODIUM CHLORIDE 0.9 % IV SOLN
INTRAVENOUS | Status: DC
  Administered 2017-10-06 – 2017-10-09 (×6): via INTRAVENOUS

## 2017-10-06 MED ORDER — PROPOFOL 10 MG/ML IV BOLUS
INTRAVENOUS | Status: DC | PRN
  Administered 2017-10-06 (×3): 10 mg via INTRAVENOUS

## 2017-10-06 MED ORDER — ONDANSETRON HCL 4 MG/2ML IJ SOLN
INTRAMUSCULAR | Status: AC
  Filled 2017-10-06: qty 2

## 2017-10-06 MED ORDER — ALBUTEROL SULFATE (2.5 MG/3ML) 0.083% IN NEBU
INHALATION_SOLUTION | RESPIRATORY_TRACT | Status: AC
  Filled 2017-10-06: qty 3

## 2017-10-06 MED ORDER — METOCLOPRAMIDE HCL 5 MG PO TABS: 5.0000 mg | ORAL_TABLET | Freq: Three times a day (TID) | ORAL | Status: DC | PRN

## 2017-10-06 SURGICAL SUPPLY — 34 items
BAG ZIPLOCK 12X15 (MISCELLANEOUS) IMPLANT
BENZOIN TINCTURE PRP APPL 2/3 (GAUZE/BANDAGES/DRESSINGS) IMPLANT
BLADE SAW SGTL 18X1.27X75 (BLADE) ×2 IMPLANT
CAPT HIP TOTAL 2 ×2 IMPLANT
CELLS DAT CNTRL 66122 CELL SVR (MISCELLANEOUS) ×1 IMPLANT
COVER PERINEAL POST (MISCELLANEOUS) ×2 IMPLANT
COVER SURGICAL LIGHT HANDLE (MISCELLANEOUS) ×2 IMPLANT
DRAPE STERI IOBAN 125X83 (DRAPES) ×2 IMPLANT
DRAPE U-SHAPE 47X51 STRL (DRAPES) ×4 IMPLANT
DRESSING AQUACEL AG SP 3.5X10 (GAUZE/BANDAGES/DRESSINGS) ×1 IMPLANT
DRSG AQUACEL AG ADV 3.5X10 (GAUZE/BANDAGES/DRESSINGS) ×2 IMPLANT
DRSG AQUACEL AG SP 3.5X10 (GAUZE/BANDAGES/DRESSINGS) ×2
DURAPREP 26ML APPLICATOR (WOUND CARE) ×2 IMPLANT
ELECT REM PT RETURN 15FT ADLT (MISCELLANEOUS) ×2 IMPLANT
GAUZE XEROFORM 1X8 LF (GAUZE/BANDAGES/DRESSINGS) ×4 IMPLANT
GLOVE BIO SURGEON STRL SZ7.5 (GLOVE) ×2 IMPLANT
GLOVE BIOGEL PI IND STRL 8 (GLOVE) ×2 IMPLANT
GLOVE BIOGEL PI INDICATOR 8 (GLOVE) ×2
GLOVE ECLIPSE 8.0 STRL XLNG CF (GLOVE) ×2 IMPLANT
GOWN STRL REUS W/TWL XL LVL3 (GOWN DISPOSABLE) ×4 IMPLANT
HANDPIECE INTERPULSE COAX TIP (DISPOSABLE) ×1
HOLDER FOLEY CATH W/STRAP (MISCELLANEOUS) ×2 IMPLANT
PACK ANTERIOR HIP CUSTOM (KITS) ×2 IMPLANT
RTRCTR WOUND ALEXIS 18CM MED (MISCELLANEOUS) ×2
SET HNDPC FAN SPRY TIP SCT (DISPOSABLE) ×1 IMPLANT
STAPLER VISISTAT 35W (STAPLE) ×2 IMPLANT
STRIP CLOSURE SKIN 1/2X4 (GAUZE/BANDAGES/DRESSINGS) IMPLANT
SUT ETHIBOND NAB CT1 #1 30IN (SUTURE) ×2 IMPLANT
SUT VIC AB 0 CT1 36 (SUTURE) ×2 IMPLANT
SUT VIC AB 1 CT1 36 (SUTURE) ×2 IMPLANT
SUT VIC AB 2-0 CT1 27 (SUTURE) ×2
SUT VIC AB 2-0 CT1 TAPERPNT 27 (SUTURE) ×2 IMPLANT
TRAY FOLEY CATH 14FR (SET/KITS/TRAYS/PACK) ×2 IMPLANT
YANKAUER SUCT BULB TIP 10FT TU (MISCELLANEOUS) ×2 IMPLANT

## 2017-10-06 NOTE — H&P (Signed)
TOTAL HIP ADMISSION H&P  Patient is admitted for right total hip arthroplasty.  Subjective:  Chief Complaint: right hip pain  HPI: Becky Douglas, 71 y.o. female, has a history of pain and functional disability in the right hip(s) due to arthritis and patient has failed non-surgical conservative treatments for greater than 12 weeks to include NSAID's and/or analgesics, corticosteriod injections, flexibility and strengthening excercises, use of assistive devices, weight reduction as appropriate and activity modification.  Onset of symptoms was gradual starting 1 years ago with gradually worsening course since that time.The patient noted no past surgery on the right hip(s).  Patient currently rates pain in the right hip at 10 out of 10 with activity. Patient has night pain, worsening of pain with activity and weight bearing, trendelenberg gait, pain that interfers with activities of daily living, pain with passive range of motion and crepitus. Patient has evidence of subchondral sclerosis, periarticular osteophytes and joint space narrowing by imaging studies. This condition presents safety issues increasing the risk of falls.  There is no current active infection.  Patient Active Problem List   Diagnosis Date Noted  . Pain in right hip 09/11/2017  . Unilateral primary osteoarthritis, right hip 09/11/2017  . Vitamin D deficiency 08/24/2006  . Morbid obesity (HCC) 08/11/2006  . Hypercholesterolemia 08/11/2006  . Coronary atherosclerosis of unspecified type of vessel, native or graft 08/11/2006  . Type II or unspecified type diabetes mellitus without mention of complication, not stated as uncontrolled 08/11/2006   Past Medical History:  Diagnosis Date  . Anxiety   . Arthritis   . COPD (chronic obstructive pulmonary disease) (HCC)   . Coronary artery disease   . Depression   . Diabetes mellitus without complication (HCC)    type II   . Dyspnea   . Dysrhythmia    " every now and again out of  rhythm "   . GERD (gastroesophageal reflux disease)   . Heart murmur   . Left rotator cuff tear     Past Surgical History:  Procedure Laterality Date  . ABDOMINAL HYSTERECTOMY    . CORONARY ARTERY BYPASS GRAFT  2002  . EYE SURGERY     bilateral cataract surgery   . heel spurs removed from bilateral feet     . HERNIA REPAIR    . right knee arthroscopy     . right wrist surgery       Current Facility-Administered Medications  Medication Dose Route Frequency Provider Last Rate Last Dose  . ceFAZolin (ANCEF) 2-4 GM/100ML-% IVPB           . ceFAZolin (ANCEF) IVPB 2g/100 mL premix  2 g Intravenous On Call to OR Kirtland Bouchardlark, Gilbert W, PA-C      . chlorhexidine (HIBICLENS) 4 % liquid 4 application  60 mL Topical Once Richardean Canallark, Gilbert W, PA-C      . lactated ringers infusion   Intravenous Continuous Jairo BenJackson, [place], MD      . tranexamic acid (CYKLOKAPRON) 1,000 mg in sodium chloride 0.9 % 100 mL IVPB  1,000 mg Intravenous To OR Kirtland Bouchardlark, Gilbert W, PA-C       No Known Allergies  Social History  Substance Use Topics  . Smoking status: Current Every Day Smoker    Packs/day: 0.50    Years: 53.00    Types: Cigarettes  . Smokeless tobacco: Never Used  . Alcohol use Yes     Comment: occ beer     History reviewed. No pertinent family history.   Review of  Systems  Musculoskeletal: Positive for joint pain.  All other systems reviewed and are negative.   Objective:  Physical Exam  Constitutional: She is oriented to person, place, and time. She appears well-developed and well-nourished.  HENT:  Head: Normocephalic and atraumatic.  Eyes: Pupils are equal, round, and reactive to light. EOM are normal.  Neck: Normal range of motion. Neck supple.  Cardiovascular: Normal rate and regular rhythm.   Respiratory: Effort normal and breath sounds normal.  GI: Soft. Bowel sounds are normal.  Musculoskeletal:       Right hip: She exhibits decreased range of motion, decreased strength, tenderness and  bony tenderness.  Neurological: She is alert and oriented to person, place, and time.  Skin: Skin is warm and dry.  Psychiatric: She has a normal mood and affect.    Vital signs in last 24 hours: Pulse Rate:  [70] 70 (11/02 0725) Resp:  [20] 20 (11/02 0725) BP: (155)/(71) 155/71 (11/02 0725)  Labs:   Estimated body mass index is 35.02 kg/m as calculated from the following:   Height as of 10/02/17: 5\' 6"  (1.676 m).   Weight as of 10/02/17: 217 lb (98.4 kg).   Imaging Review Plain radiographs demonstrate severe degenerative joint disease of the right hip(s). The bone quality appears to be good for age and reported activity level.  Assessment/Plan:  End stage arthritis, right hip(s)  The patient history, physical examination, clinical judgement of the provider and imaging studies are consistent with end stage degenerative joint disease of the right hip(s) and total hip arthroplasty is deemed medically necessary. The treatment options including medical management, injection therapy, arthroscopy and arthroplasty were discussed at length. The risks and benefits of total hip arthroplasty were presented and reviewed. The risks due to aseptic loosening, infection, stiffness, dislocation/subluxation,  thromboembolic complications and other imponderables were discussed.  The patient acknowledged the explanation, agreed to proceed with the plan and consent was signed. Patient is being admitted for inpatient treatment for surgery, pain control, PT, OT, prophylactic antibiotics, VTE prophylaxis, progressive ambulation and ADL's and discharge planning.The patient is planning to be discharged home with home health services

## 2017-10-06 NOTE — Addendum Note (Signed)
Addendum  created 10/06/17 1305 by Eilene Ghaziose, Kema Santaella, MD   Sign clinical note

## 2017-10-06 NOTE — Progress Notes (Signed)
X-ray results noted 

## 2017-10-06 NOTE — Anesthesia Procedure Notes (Signed)
Spinal  Patient location during procedure: OR Start time: 10/06/2017 10:02 AM End time: 10/06/2017 10:05 AM Staffing Anesthesiologist: ROSE, Greggory StallionGEORGE Resident/CRNA: Lyda KalataJARVELA, Valentina Alcoser R Performed: resident/CRNA  Preanesthetic Checklist Completed: patient identified, site marked, surgical consent, pre-op evaluation, timeout performed, IV checked, risks and benefits discussed and monitors and equipment checked Spinal Block Patient position: sitting Prep: DuraPrep Patient monitoring: heart rate, cardiac monitor, continuous pulse ox and blood pressure Approach: midline Location: L3-4 Injection technique: single-shot Needle Needle type: Pencan  Needle gauge: 24 G Needle length: 10 cm Needle insertion depth: 7 cm Assessment Sensory level: T6

## 2017-10-06 NOTE — Anesthesia Preprocedure Evaluation (Addendum)
Anesthesia Evaluation  Patient identified by MRN, date of birth, ID band Patient awake    Reviewed: Allergy & Precautions, NPO status , Patient's Chart, lab work & pertinent test results  Airway Mallampati: II  TM Distance: >3 FB Neck ROM: Full    Dental no notable dental hx.    Pulmonary COPD, Current Smoker,     + decreased breath sounds+ wheezing      Cardiovascular + CAD and + CABG  Normal cardiovascular exam Rhythm:Regular Rate:Normal     Neuro/Psych negative neurological ROS  negative psych ROS   GI/Hepatic negative GI ROS, Neg liver ROS,   Endo/Other  diabetes  Renal/GU negative Renal ROS  negative genitourinary   Musculoskeletal negative musculoskeletal ROS (+)   Abdominal   Peds negative pediatric ROS (+)  Hematology negative hematology ROS (+)   Anesthesia Other Findings RA sat 87%  Reproductive/Obstetrics negative OB ROS                           Anesthesia Physical Anesthesia Plan  ASA: III  Anesthesia Plan: Spinal   Post-op Pain Management:    Induction: Intravenous  PONV Risk Score and Plan: 1 and Ondansetron and Dexamethasone  Airway Management Planned: Simple Face Mask  Additional Equipment:   Intra-op Plan:   Post-operative Plan:   Informed Consent: I have reviewed the patients History and Physical, chart, labs and discussed the procedure including the risks, benefits and alternatives for the proposed anesthesia with the patient or authorized representative who has indicated his/her understanding and acceptance.   Dental advisory given  Plan Discussed with: CRNA and Surgeon  Anesthesia Plan Comments:         Anesthesia Quick Evaluation

## 2017-10-06 NOTE — Brief Op Note (Signed)
10/06/2017  11:22 AM  PATIENT:  Ferdinand CavaJeanette Darwin  71 y.o. female  PRE-OPERATIVE DIAGNOSIS:  osteoarthritis right hip  POST-OPERATIVE DIAGNOSIS:  osteoarthritis right hip  PROCEDURE:  Procedure(s): RIGHT TOTAL HIP ARTHROPLASTY ANTERIOR APPROACH (Right)  SURGEON:  Surgeon(s) and Role:    Kathryne Hitch* Parrish Daddario Y, MD - Primary  PHYSICIAN ASSISTANT: Rexene EdisonGil Clark, PA-C  ANESTHESIA:   spinal  EBL:  200 mL   COUNTS:  YES  DICTATION: .Other Dictation: Dictation Number [id number]  PLAN OF CARE: Admit to inpatient   PATIENT DISPOSITION:  PACU - hemodynamically stable.   Delay start of Pharmacological VTE agent (>24hrs) due to surgical blood loss or risk of bleeding: no

## 2017-10-06 NOTE — Progress Notes (Signed)
Portable AP Pelvis X-ray done. 

## 2017-10-06 NOTE — Anesthesia Postprocedure Evaluation (Signed)
Anesthesia Post Note  Patient: Becky CavaJeanette Douglas  Procedure(s) Performed: RIGHT TOTAL HIP ARTHROPLASTY ANTERIOR APPROACH (Right Hip)     Patient location during evaluation: PACU Anesthesia Type: Spinal Level of consciousness: oriented and awake and alert Pain management: pain level controlled Vital Signs Assessment: post-procedure vital signs reviewed and stable Respiratory status: spontaneous breathing, respiratory function stable and patient connected to nasal cannula oxygen Cardiovascular status: blood pressure returned to baseline and stable Postop Assessment: no headache, no backache and no apparent nausea or vomiting Anesthetic complications: no    Last Vitals:  Vitals:   10/06/17 1153 10/06/17 1155  BP:    Pulse: (!) 58 (!) 59  Resp: 15 13  Temp:    SpO2: 94% 93%    Last Pain:  Vitals:   10/06/17 0725  TempSrc: Oral                 [name]

## 2017-10-06 NOTE — Evaluation (Signed)
Physical Therapy Evaluation Patient Details Name: Becky CavaJeanette Balistreri MRN: [id number] DOB: 11-05-1946 Today's Date: 10/06/2017   History of Present Illness  Pt s/p R THR and with hx of CAD, COPD and CABG.  Clinical Impression  Pt s/p R THR and presents with decreased R LE strength/ROM, post op pain and obesity limiting functional mobility. Pt should progress to dc home with assist of dtr.    Follow Up Recommendations Home health PT    Equipment Recommendations  Rolling walker with 5" wheels    Recommendations for Other Services OT consult     Precautions / Restrictions Precautions Precautions: Fall Restrictions Weight Bearing Restrictions: No Other Position/Activity Restrictions: WBAT      Mobility  Bed Mobility Overal bed mobility: Needs Assistance Bed Mobility: Supine to Sit     Supine to sit: HOB elevated;Min assist;Mod assist;+2 for physical assistance;+2 for safety/equipment     General bed mobility comments: cues for sequence and use of L LE to self assist  Transfers Overall transfer level: Needs assistance Equipment used: Rolling walker (2 wheeled) Transfers: Sit to/from Stand Sit to Stand: Min assist         General transfer comment: cues for LE management and use of UEs to self assist  Ambulation/Gait Ambulation/Gait assistance: Min assist;+2 safety/equipment Ambulation Distance (Feet): 12 Feet Assistive device: Rolling walker (2 wheeled) Gait Pattern/deviations: Step-to pattern;Decreased step length - right;Decreased step length - left;Shuffle;Trunk flexed Gait velocity: decr Gait velocity interpretation: Below normal speed for age/gender General Gait Details: cues for sequence, posture and position from RW.  Pt ltd by fatigue  Stairs            Wheelchair Mobility    Modified Rankin (Stroke Patients Only)       Balance Overall balance assessment: Needs assistance Sitting-balance support: Feet supported;No upper extremity  supported Sitting balance-Leahy Scale: Good     Standing balance support: Bilateral upper extremity supported Standing balance-Leahy Scale: Poor                               Pertinent Vitals/Pain Pain Assessment: 0-10 Pain Score: 5  Pain Location: R hip Pain Descriptors / Indicators: Aching;Sore Pain Intervention(s): Limited activity within patient's tolerance;Monitored during session;Premedicated before session;Ice applied    Home Living Family/patient expects to be discharged to:: Private residence Living Arrangements: Alone Available Help at Discharge: Family Type of Home: House Home Access: Stairs to enter Entrance Stairs-Rails: Doctor, general practiceight;Left Entrance Stairs-Number of Steps: 3 Home Layout: One level Home Equipment: Cane - single point Additional Comments: Will be staying with dtr initially    Prior Function Level of Independence: Independent;Independent with assistive device(s)               Hand Dominance        Extremity/Trunk Assessment   Upper Extremity Assessment Upper Extremity Assessment: Overall WFL for tasks assessed    Lower Extremity Assessment Lower Extremity Assessment: RLE deficits/detail    Cervical / Trunk Assessment Cervical / Trunk Assessment: Kyphotic  Communication   Communication: No difficulties  Cognition Arousal/Alertness: Awake/alert Behavior During Therapy: WFL for tasks assessed/performed Overall Cognitive Status: Within Functional Limits for tasks assessed                                        General Comments      Exercises Total Joint Exercises Ankle  Circles/Pumps: AROM;Both;15 reps;Supine   Assessment/Plan    PT Assessment Patient needs continued PT services  PT Problem List Decreased strength;Decreased range of motion;Decreased activity tolerance;Decreased balance;Decreased mobility;Decreased knowledge of use of DME;Pain;Obesity       PT Treatment Interventions DME  instruction;Gait training;Stair training;Functional mobility training;Therapeutic activities;Therapeutic exercise;Patient/family education    PT Goals (Current goals can be found in the Care Plan section)  Acute Rehab PT Goals Patient Stated Goal: Regain IND and walk with less pain PT Goal Formulation: With patient Time For Goal Achievement: 10/11/17 Potential to Achieve Goals: Good    Frequency 7X/week   Barriers to discharge        Co-evaluation               AM-PAC PT "6 Clicks" Daily Activity  Outcome Measure Difficulty turning over in bed (including adjusting bedclothes, sheets and blankets)?: Unable Difficulty moving from lying on back to sitting on the side of the bed? : Unable Difficulty sitting down on and standing up from a chair with arms (e.g., wheelchair, bedside commode, etc,.)?: Unable Help needed moving to and from a bed to chair (including a wheelchair)?: A Lot Help needed walking in hospital room?: A Little Help needed climbing 3-5 steps with a railing? : A Lot 6 Click Score: 10    End of Session Equipment Utilized During Treatment: Gait belt;Oxygen Activity Tolerance: Patient tolerated treatment well;Patient limited by lethargy Patient left: in chair;with call bell/phone within reach;with family/visitor present Nurse Communication: Mobility status PT Visit Diagnosis: Unsteadiness on feet (R26.81);Difficulty in walking, not elsewhere classified (R26.2)    Time: 4098-1191 PT Time Calculation (min) (ACUTE ONLY): 32 min   Charges:   PT Evaluation $PT Eval Low Complexity: 1 Low PT Treatments $Gait Training: 8-22 mins   PT G Codes:        Pg [phone]   Amauri Keefe 10/06/2017, 5:40 PM

## 2017-10-06 NOTE — Transfer of Care (Signed)
Immediate Anesthesia Transfer of Care Note  Patient: Becky CavaJeanette Ketron  Procedure(s) Performed: RIGHT TOTAL HIP ARTHROPLASTY ANTERIOR APPROACH (Right Hip)  Patient Location: PACU  Anesthesia Type:Spinal  Level of Consciousness: awake, alert  and oriented  Airway & Oxygen Therapy: Patient Spontanous Breathing  Post-op Assessment: Report given to RN and Post -op Vital signs reviewed and stable  Post vital signs: Reviewed and stable  Last Vitals:  Vitals:   10/06/17 0725  BP: (!) 155/71  Pulse: 70  Resp: 20    Last Pain:  Vitals:   10/06/17 0725  TempSrc: Oral      Patients Stated Pain Goal: 4 (10/06/17 0747)  Complications: No apparent anesthesia complications

## 2017-10-07 LAB — CBC
HEMATOCRIT: 34.2 % — AB (ref 36.0–46.0)
HEMOGLOBIN: 10.4 g/dL — AB (ref 12.0–15.0)
MCH: 27.6 pg (ref 26.0–34.0)
MCHC: 30.4 g/dL (ref 30.0–36.0)
MCV: 90.7 fL (ref 78.0–100.0)
Platelets: 369 10*3/uL (ref 150–400)
RBC: 3.77 MIL/uL — AB (ref 3.87–5.11)
RDW: 14.4 % (ref 11.5–15.5)
WBC: 17.7 10*3/uL — AB (ref 4.0–10.5)

## 2017-10-07 LAB — BASIC METABOLIC PANEL
ANION GAP: 8 (ref 5–15)
BUN: 20 mg/dL (ref 6–20)
CALCIUM: 8.5 mg/dL — AB (ref 8.9–10.3)
CHLORIDE: 104 mmol/L (ref 101–111)
CO2: 28 mmol/L (ref 22–32)
Creatinine, Ser: 1.23 mg/dL — ABNORMAL HIGH (ref 0.44–1.00)
GFR calc non Af Amer: 43 mL/min — ABNORMAL LOW (ref >60)
GFR, EST AFRICAN AMERICAN: 50 mL/min — AB (ref >60)
GLUCOSE: 107 mg/dL — AB (ref 65–99)
POTASSIUM: 5.3 mmol/L — AB (ref 3.5–5.1)
Sodium: 140 mmol/L (ref 135–145)

## 2017-10-07 LAB — GLUCOSE, CAPILLARY
GLUCOSE-CAPILLARY: 156 mg/dL — AB (ref 65–99)
GLUCOSE-CAPILLARY: 139 mg/dL — AB (ref 65–99)
GLUCOSE-CAPILLARY: 148 mg/dL — AB (ref 65–99)
Glucose-Capillary: 114 mg/dL — ABNORMAL HIGH (ref 65–99)

## 2017-10-07 MED ORDER — OXYCODONE-ACETAMINOPHEN 5-325 MG PO TABS: 1.0000 | ORAL_TABLET | ORAL | 0 refills | Status: DC | PRN

## 2017-10-07 MED ORDER — METHOCARBAMOL 500 MG PO TABS: 500.0000 mg | ORAL_TABLET | Freq: Four times a day (QID) | ORAL | 1 refills | Status: DC | PRN

## 2017-10-07 MED ORDER — ASPIRIN 81 MG PO CHEW: 81.0000 mg | CHEWABLE_TABLET | Freq: Two times a day (BID) | ORAL | 0 refills | Status: AC

## 2017-10-07 NOTE — Evaluation (Signed)
Occupational Therapy Evaluation Patient Details Name: Becky Douglas MRN: [id number] DOB: [birth year] Today's Date: 10/07/2017    History of Present Illness Pt s/p R THR and with hx of CAD, COPD and CABG.   Clinical Impression   Pt with 10/10 pain with return to bed during OT session. Nursing made aware and came to room to give pain meds. Pt also fatigues rapidly and only able to tolerate a pivot transfer back to bed this session. Began education on AE. Pt is on O2 at home but with transfer today, sats decreased to 85% on 4L . Encouraged PLB and rest and sats still 86-87% on 4L so increased to 5L and sats up to 88%. Nursing came to room and aware of O2 readings.     Follow Up Recommendations  Supervision/Assistance - 24 hour;Home health OT    Equipment Recommendations  None recommended by OT    Recommendations for Other Services       Precautions / Restrictions Precautions Precautions: Fall Precaution Comments: monitor O2 Restrictions Weight Bearing Restrictions: No Other Position/Activity Restrictions: WBAT      Mobility Bed Mobility Overal bed mobility: Needs Assistance Bed Mobility: Sit to Supine      Sit to supine: +2 for physical assistance;+2 for safety/equipment;Mod assist   General bed mobility comments: increased time and effort. Cues for sequence and how to self assist using L LE.   Transfers Overall transfer level: Needs assistance Equipment used: Rolling walker (2 wheeled) Transfers: Sit to/from Stand Sit to Stand: Mod assist         General transfer comment: verbal cues for LE management and use of UEs.    Balance Overall balance assessment: Needs assistance Sitting-balance support: Feet supported;No upper extremity supported Sitting balance-Leahy Scale: Good     Standing balance support: Bilateral upper extremity supported Standing balance-Leahy Scale: Poor                             ADL either performed or assessed with clinical  judgement   ADL Overall ADL's : Needs assistance/impaired Eating/Feeding: Independent;Sitting   Grooming: Wash/dry hands;Set up;Sitting   Upper Body Bathing: Set up;Sitting   Lower Body Bathing: Sit to/from stand;Maximal assistance   Upper Body Dressing : Minimal assistance;Sitting   Lower Body Dressing: Total assistance;Sit to/from stand   Toilet Transfer: Moderate assistance;Stand-pivot;RW   Toileting- Clothing Manipulation and Hygiene: Maximal assistance;Sit to/from stand         General ADL Comments: Pt with 10/10 pain when OT arrived-nursing made aware and came to give pain meds. Pt also reporting being very fatigued and requesting return to bed. Assisted with stand pivot transfer back to bed and O2 sats down to 85% on 4L. After several minutes and encouragemetn of PLB, sats still 87%. Increased O2 to 5L and O2 sats 88% and nursing made aware of all. Pt not able to tolerate AE practice but did demonstrate all AE. She states family will also help with LB dressing.      Vision Patient Visual Report: No change from baseline       Perception     Praxis      Pertinent Vitals/Pain Pain Assessment: 0-10 Pain Score: 8  Pain Location: R hip Pain Descriptors / Indicators: Aching;Sore Pain Intervention(s): Limited activity within patient's tolerance;Monitored during session;Premedicated before session;Ice applied     Hand Dominance Right   Extremity/Trunk Assessment Upper Extremity Assessment Upper Extremity Assessment: Overall WFL for tasks assessed  Communication Communication Communication: No difficulties   Cognition Arousal/Alertness: Awake/alert Behavior During Therapy: WFL for tasks assessed/performed Overall Cognitive Status: Within Functional Limits for tasks assessed                                     General Comments       Exercises    Shoulder Instructions      Home Living Family/patient expects to be discharged to::  Private residence Living Arrangements: Alone Available Help at Discharge: Family Type of Home: House Home Access: Stairs to enter Secretary/administratorntrance Stairs-Number of Steps: 3 Entrance Stairs-Rails: Right;Left Home Layout: One level     Bathroom Shower/Tub: Chief Strategy OfficerTub/shower unit   Bathroom Toilet: Handicapped height     Home Equipment: Cane - single point;Bedside commode;Shower seat;Adaptive equipment Adaptive Equipment: Reacher Additional Comments: Will be staying with dtr initially      Prior Functioning/Environment Level of Independence: Independent;Independent with assistive device(s)                 OT Problem List: Decreased strength;Decreased knowledge of use of DME or AE;Decreased activity tolerance      OT Treatment/Interventions: Self-care/ADL training;Patient/family education;DME and/or AE instruction;Therapeutic activities    OT Goals(Current goals can be found in the care plan section) Acute Rehab OT Goals Patient Stated Goal: decrease pain OT Goal Formulation: With patient Time For Goal Achievement: 10/14/17 Potential to Achieve Goals: Good  OT Frequency: Min 2X/week   Barriers to D/C:            Co-evaluation              AM-PAC PT "6 Clicks" Daily Activity     Outcome Measure Help from another person eating meals?: None Help from another person taking care of personal grooming?: A Little Help from another person toileting, which includes using toliet, bedpan, or urinal?: A Lot Help from another person bathing (including washing, rinsing, drying)?: A Lot Help from another person to put on and taking off regular upper body clothing?: A Little Help from another person to put on and taking off regular lower body clothing?: A Lot 6 Click Score: 16   End of Session Equipment Utilized During Treatment: Rolling walker;Oxygen  Activity Tolerance: Patient limited by fatigue;Patient limited by pain Patient left: in bed;with call bell/phone within reach  OT  Visit Diagnosis: Unsteadiness on feet (R26.81);Pain Pain - Right/Left: Right Pain - part of body: Hip                Time: 1610-96041042-1130 OT Time Calculation (min): 48 min Charges:  OT General Charges $OT Visit: 1 Visit OT Evaluation $OT Eval Low Complexity: 1 Low OT Treatments $Self Care/Home Management : 8-22 mins $Therapeutic Activity: 8-22 mins G-Codes:       Zannie KehrStephanie S Nevyn Bossman 10/07/2017, 12:54 PM

## 2017-10-07 NOTE — Progress Notes (Signed)
Physical Therapy Treatment Patient Details Name: Becky Douglas MRN: [id number] DOB: Apr 06, 1946 Today's Date: 10/07/2017    History of Present Illness Pt s/p R THR and with hx of CAD, COPD and CABG.    PT Comments    Pt progressing slowly with mobility - ltd by premorbid deconditioning.    Follow Up Recommendations  Home health PT     Equipment Recommendations  Rolling walker with 5" wheels    Recommendations for Other Services OT consult     Precautions / Restrictions Precautions Precautions: Fall Precaution Comments: monitor O2 Restrictions Weight Bearing Restrictions: No Other Position/Activity Restrictions: WBAT    Mobility  Bed Mobility Overal bed mobility: Needs Assistance Bed Mobility: Supine to Sit     Supine to sit: HOB elevated;Mod assist Sit to supine: +2 for physical assistance;+2 for safety/equipment;Mod assist   General bed mobility comments: increased time and effort. Cues for sequence and how to self assist using L LE.   Transfers Overall transfer level: Needs assistance Equipment used: Rolling walker (2 wheeled) Transfers: Sit to/from Stand Sit to Stand: Min assist         General transfer comment: verbal cues for LE management and use of UEs.  Ambulation/Gait Ambulation/Gait assistance: Min assist;+2 safety/equipment Ambulation Distance (Feet): 44 Feet Assistive device: Rolling walker (2 wheeled) Gait Pattern/deviations: Decreased step length - right;Decreased step length - left;Shuffle;Trunk flexed;Step-to pattern;Step-through pattern Gait velocity: decr Gait velocity interpretation: Below normal speed for age/gender General Gait Details: cues for sequence, posture and position from RW.  Pt ltd by fatigue   Stairs            Wheelchair Mobility    Modified Rankin (Stroke Patients Only)       Balance Overall balance assessment: Needs assistance Sitting-balance support: Feet supported;No upper extremity supported Sitting  balance-Leahy Scale: Good     Standing balance support: Bilateral upper extremity supported Standing balance-Leahy Scale: Poor                              Cognition Arousal/Alertness: Awake/alert Behavior During Therapy: WFL for tasks assessed/performed Overall Cognitive Status: Within Functional Limits for tasks assessed                                        Exercises      General Comments        Pertinent Vitals/Pain Pain Assessment: 0-10 Pain Score: 6  Pain Location: R hip Pain Descriptors / Indicators: Aching;Sore Pain Intervention(s): Limited activity within patient's tolerance;Monitored during session;Premedicated before session;Ice applied    Home Living Family/patient expects to be discharged to:: Private residence Living Arrangements: Alone Available Help at Discharge: Family Type of Home: House Home Access: Stairs to enter Entrance Stairs-Rails: Right;Left Home Layout: One level Home Equipment: Cane - single point;Bedside commode;Shower seat;Adaptive equipment Additional Comments: Will be staying with dtr initially    Prior Function Level of Independence: Independent;Independent with assistive device(s)          PT Goals (current goals can now be found in the care plan section) Acute Rehab PT Goals Patient Stated Goal: decrease pain PT Goal Formulation: With patient Time For Goal Achievement: 10/11/17 Potential to Achieve Goals: Good Progress towards PT goals: Progressing toward goals    Frequency    7X/week      PT Plan Current plan remains appropriate  Co-evaluation              AM-PAC PT "6 Clicks" Daily Activity  Outcome Measure  Difficulty turning over in bed (including adjusting bedclothes, sheets and blankets)?: Unable Difficulty moving from lying on back to sitting on the side of the bed? : Unable Difficulty sitting down on and standing up from a chair with arms (e.g., wheelchair, bedside  commode, etc,.)?: Unable Help needed moving to and from a bed to chair (including a wheelchair)?: A Lot Help needed walking in hospital room?: A Little Help needed climbing 3-5 steps with a railing? : A Lot 6 Click Score: 10    End of Session Equipment Utilized During Treatment: Gait belt;Oxygen Activity Tolerance: Patient limited by fatigue;Patient limited by pain Patient left: in chair;with call bell/phone within reach Nurse Communication: Mobility status PT Visit Diagnosis: Unsteadiness on feet (R26.81);Difficulty in walking, not elsewhere classified (R26.2)     Time: [id number] PT Time Calculation (min) (ACUTE ONLY): 31 min  Charges:  $Gait Training: 23-37 mins                    G Codes:       Pg [phone]    Lenzi Marmo 10/07/2017, 4:25 PM

## 2017-10-07 NOTE — Progress Notes (Signed)
Physical Therapy Treatment Patient Details Name: Becky Douglas MRN: [id number] DOB: May 16, 1946 Today's Date: 10/07/2017    History of Present Illness Pt s/p R THR and with hx of CAD, COPD and CABG.    PT Comments    Pt cooperative but progressing slowly 2* fatigue, pain and premorbid deconditioning.   Follow Up Recommendations  Home health PT     Equipment Recommendations  Rolling walker with 5" wheels    Recommendations for Other Services OT consult     Precautions / Restrictions Precautions Precautions: Fall Restrictions Weight Bearing Restrictions: No Other Position/Activity Restrictions: WBAT    Mobility  Bed Mobility Overal bed mobility: Needs Assistance Bed Mobility: Supine to Sit     Supine to sit: HOB elevated;Min assist;Mod assist;+2 for physical assistance;+2 for safety/equipment     General bed mobility comments: Increased time with cues for sequence and use of L LE to self assist  Transfers Overall transfer level: Needs assistance Equipment used: Rolling walker (2 wheeled) Transfers: Sit to/from Stand Sit to Stand: Min assist         General transfer comment: cues for LE management and use of UEs to self assist  Ambulation/Gait Ambulation/Gait assistance: Min assist;+2 safety/equipment Ambulation Distance (Feet): 21 Feet Assistive device: Rolling walker (2 wheeled) Gait Pattern/deviations: Step-to pattern;Decreased step length - right;Decreased step length - left;Shuffle;Trunk flexed Gait velocity: decr Gait velocity interpretation: Below normal speed for age/gender General Gait Details: cues for sequence, posture and position from RW.  Pt ltd by fatigue   Stairs            Wheelchair Mobility    Modified Rankin (Stroke Patients Only)       Balance Overall balance assessment: Needs assistance Sitting-balance support: Feet supported;No upper extremity supported Sitting balance-Leahy Scale: Good     Standing balance support:  Bilateral upper extremity supported Standing balance-Leahy Scale: Poor                              Cognition Arousal/Alertness: Awake/alert Behavior During Therapy: WFL for tasks assessed/performed Overall Cognitive Status: Within Functional Limits for tasks assessed                                        Exercises Total Joint Exercises Ankle Circles/Pumps: AROM;Both;15 reps;Supine Quad Sets: AROM;Both;10 reps;Supine Heel Slides: AAROM;Right;15 reps;Supine Hip ABduction/ADduction: AAROM;Right;10 reps;Supine    General Comments        Pertinent Vitals/Pain Pain Assessment: 0-10 Pain Score: 8  Pain Location: R hip Pain Descriptors / Indicators: Aching;Sore Pain Intervention(s): Limited activity within patient's tolerance;Monitored during session;Premedicated before session;Ice applied    Home Living                      Prior Function            PT Goals (current goals can now be found in the care plan section) Acute Rehab PT Goals Patient Stated Goal: Regain IND and walk with less pain PT Goal Formulation: With patient Time For Goal Achievement: 10/11/17 Potential to Achieve Goals: Good Progress towards PT goals: Progressing toward goals    Frequency    7X/week      PT Plan Current plan remains appropriate    Co-evaluation              AM-PAC PT "6 Clicks" Daily  Activity  Outcome Measure  Difficulty turning over in bed (including adjusting bedclothes, sheets and blankets)?: Unable Difficulty moving from lying on back to sitting on the side of the bed? : Unable Difficulty sitting down on and standing up from a chair with arms (e.g., wheelchair, bedside commode, etc,.)?: Unable Help needed moving to and from a bed to chair (including a wheelchair)?: A Lot Help needed walking in hospital room?: A Little Help needed climbing 3-5 steps with a railing? : A Lot 6 Click Score: 10    End of Session Equipment Utilized  During Treatment: Gait belt;Oxygen Activity Tolerance: Patient limited by fatigue;Patient limited by pain Patient left: in chair;with call bell/phone within reach Nurse Communication: Mobility status PT Visit Diagnosis: Unsteadiness on feet (R26.81);Difficulty in walking, not elsewhere classified (R26.2)     Time: [id number] PT Time Calculation (min) (ACUTE ONLY): 35 min  Charges:  $Gait Training: 8-22 mins $Therapeutic Exercise: 8-22 mins                    G Codes:       Pg [phone]    Chrisa Hassan 10/07/2017, 12:21 PM

## 2017-10-07 NOTE — Discharge Instructions (Signed)

## 2017-10-07 NOTE — Progress Notes (Signed)
Subjective: 1 Day Post-Op Procedure(s) (LRB): RIGHT TOTAL HIP ARTHROPLASTY ANTERIOR APPROACH (Right) Patient reports pain as moderate.  Sitting on edge of bed no complaints.  Objective: Vital signs in last 24 hours: Temp:  [97.4 F (36.3 C)-98.3 F (36.8 C)] 97.4 F (36.3 C) (11/03 0514) Pulse Rate:  [57-89] 71 (11/03 0514) Resp:  [7-25] 12 (11/03 0514) BP: (107-154)/(45-89) 132/57 (11/03 0514) SpO2:  [82 %-97 %] 92 % (11/03 0514)  Intake/Output from previous day: 11/02 0701 - 11/03 0700 In: 3590 [P.O.:840; I.V.:2700; IV Piggyback:50] Out: 1925 [Urine:1725; Blood:200] Intake/Output this shift: Total I/O In: 480 [P.O.:480] Out: -    Recent Labs  10/07/17 0651  HGB 10.4*    Recent Labs  10/07/17 0651  WBC 17.7*  RBC 3.77*  HCT 34.2*  PLT 369    Recent Labs  10/07/17 0651  NA 140  K 5.3*  CL 104  CO2 28  BUN 20  CREATININE 1.23*  GLUCOSE 107*  CALCIUM 8.5*   No results for input(s): LABPT, INR in the last 72 hours.  Right lower extremity: Dorsiflexion/Plantar flexion intact Incision: scant drainage Compartment soft  Assessment/Plan: 1 Day Post-Op Procedure(s) (LRB): RIGHT TOTAL HIP ARTHROPLASTY ANTERIOR APPROACH (Right) Up with therapy  Possible discharge home tomorrow  Check CBC in AM  (Leukocytosis ,afebrile  Most likely stress induced )  GILBERT CLARK 10/07/2017, 8:58 AM

## 2017-10-07 NOTE — Care Management Note (Signed)
Case Management Note  Patient Details  Name: Becky Douglas MRN: [id number] Date of Birth: 12-23-1945  Subjective/Objective:     71 y.o. F s/p R THA to be discharged with RW and HHPT. [facility] Pt tells me Daughter will be in charge of arranging HHPT, as [place]. CM asked that pt obtain the name of the agency that will be providing this care. Pt agreeable.                Action/Plan:CM will follow closely for disposition/discharge needs.    Expected Discharge Date:                  Expected Discharge Plan:  Home w Home Health Services  In-House Referral:     Discharge planning Services  CM Consult  Post Acute Care Choice:  Durable Medical Equipment, Home Health Choice offered to:  Patient  DME Arranged:  Walker rolling DME Agency:  [facility]  [facility]     Status of Service:  Completed, signed off  If discussed at Long Length of Stay Meetings, dates discussed:    Additional Comments:  Yvone NeuCrutchfield, Alicia Ackert M, RN 10/07/2017, 10:53 AM

## 2017-10-08 ENCOUNTER — Other Ambulatory Visit: Payer: Self-pay

## 2017-10-08 LAB — CBC
HCT: 33.9 % — ABNORMAL LOW (ref 36.0–46.0)
HEMOGLOBIN: 10.2 g/dL — AB (ref 12.0–15.0)
MCH: 27.1 pg (ref 26.0–34.0)
MCHC: 30.1 g/dL (ref 30.0–36.0)
MCV: 90.2 fL (ref 78.0–100.0)
PLATELETS: 331 10*3/uL (ref 150–400)
RBC: 3.76 MIL/uL — AB (ref 3.87–5.11)
RDW: 14.4 % (ref 11.5–15.5)
WBC: 16.3 10*3/uL — AB (ref 4.0–10.5)

## 2017-10-08 LAB — GLUCOSE, CAPILLARY
GLUCOSE-CAPILLARY: 124 mg/dL — AB (ref 65–99)
Glucose-Capillary: 63 mg/dL — ABNORMAL LOW (ref 65–99)
Glucose-Capillary: 119 mg/dL — ABNORMAL HIGH (ref 65–99)
Glucose-Capillary: 157 mg/dL — ABNORMAL HIGH (ref 65–99)
Glucose-Capillary: 154 mg/dL — ABNORMAL HIGH (ref 65–99)

## 2017-10-08 MED ORDER — NICOTINE 14 MG/24HR TD PT24
14.0000 mg | MEDICATED_PATCH | Freq: Every day | TRANSDERMAL | Status: DC
  Administered 2017-10-08 – 2017-10-09 (×2): 14 mg via TRANSDERMAL
  Filled 2017-10-08 (×2): qty 1

## 2017-10-08 NOTE — Progress Notes (Signed)
Physical Therapy Treatment Patient Details Name: Becky Douglas MRN: [id number] DOB: 07-Jan-1946 Today's Date: 10/08/2017    History of Present Illness Pt s/p R THR and with hx of CAD, COPD and CABG.    PT Comments    Initiated stair training with pt today.  Pt with poor recall even within a minute of instructions given.  At this time, recommend short term SNF rehab to maximize her mobility and safety. Pt's o2 was 90-95% throughout session while on 4 L/min o2.  Pt normally is on 2 L/min.  Pt with some wheezing noted and encouraged incentive spirometer, but will likely need re-inforcement.    Follow Up Recommendations  SNF     Equipment Recommendations  Rolling walker with 5" wheels    Recommendations for Other Services       Precautions / Restrictions Precautions Precautions: Fall Precaution Comments: monitor O2(tend to get a quicker reading on L hand) Restrictions Weight Bearing Restrictions: No Other Position/Activity Restrictions: WBAT    Mobility  Bed Mobility               General bed mobility comments: up in recliner upon arrival  Transfers Overall transfer level: Needs assistance Equipment used: Rolling walker (2 wheeled) Transfers: Sit to/from Stand Sit to Stand: Min assist;+2 safety/equipment         General transfer comment: cues for hand placement, use of momentum, assist to rise, steady and control descent to chair  Ambulation/Gait Ambulation/Gait assistance: Min assist;+2 safety/equipment Ambulation Distance (Feet): 45 Feet Assistive device: Rolling walker (2 wheeled) Gait Pattern/deviations: Decreased step length - right;Decreased step length - left;Trunk flexed Gait velocity: decr   General Gait Details: cues for proper sequence.  She had diffculty following directions even with cueing   Stairs Stairs: Yes   Stair Management: Two rails;Step to pattern Number of Stairs: 3 General stair comments: cues for safe technique.   Wheelchair  Mobility    Modified Rankin (Stroke Patients Only)       Balance Overall balance assessment: Needs assistance   Sitting balance-Leahy Scale: Fair       Standing balance-Leahy Scale: Poor                              Cognition Arousal/Alertness: Awake/alert Behavior During Therapy: WFL for tasks assessed/performed Overall Cognitive Status: Impaired/Different from baseline Area of Impairment: Orientation;Attention;Memory;Following commands;Safety/judgement;Problem solving                 Orientation Level: Disoriented to;Time;Place Current Attention Level: Sustained Memory: Decreased short-term memory Following Commands: Follows one step commands with increased time(frequent cueing) Safety/Judgement: Decreased awareness of safety;Decreased awareness of deficits   Problem Solving: Slow processing;Decreased initiation;Difficulty sequencing;Requires verbal cues General Comments: RN is aware of pt's change in cognition.  Pt could not remember things that happened just before.      Exercises      General Comments        Pertinent Vitals/Pain Pain Assessment: Faces Faces Pain Scale: Hurts even more Pain Location: R hip Pain Descriptors / Indicators: Aching;Sore Pain Intervention(s): Monitored during session;Premedicated before session;Repositioned    Home Living                      Prior Function            PT Goals (current goals can now be found in the care plan section) Acute Rehab PT Goals Patient Stated Goal: decrease pain PT  Goal Formulation: With patient Time For Goal Achievement: 10/11/17 Potential to Achieve Goals: Good Progress towards PT goals: Progressing toward goals    Frequency    7X/week      PT Plan Discharge plan needs to be updated    Co-evaluation   Reason for Co-Treatment: For patient/therapist safety PT goals addressed during session: Mobility/safety with mobility;Balance;Proper use of DME OT goals  addressed during session: Strengthening/ROM      AM-PAC PT "6 Clicks" Daily Activity  Outcome Measure  Difficulty turning over in bed (including adjusting bedclothes, sheets and blankets)?: Unable Difficulty moving from lying on back to sitting on the side of the bed? : Unable Difficulty sitting down on and standing up from a chair with arms (e.g., wheelchair, bedside commode, etc,.)?: Unable Help needed moving to and from a bed to chair (including a wheelchair)?: A Little Help needed walking in hospital room?: A Little Help needed climbing 3-5 steps with a railing? : A Little 6 Click Score: 12    End of Session Equipment Utilized During Treatment: Gait belt;Oxygen Activity Tolerance: Patient limited by fatigue Patient left: in chair;with call bell/phone within reach Nurse Communication: Mobility status PT Visit Diagnosis: Unsteadiness on feet (R26.81);Difficulty in walking, not elsewhere classified (R26.2)     Time: [id number] PT Time Calculation (min) (ACUTE ONLY): 39 min  Charges:  $Gait Training: 23-37 mins                    G Codes:       Dailee Manalang L. Katrinka BlazingSmith, [place]PT Pager [phone] 10/08/2017    Enzo MontgomeryKaren L Kawana Hegel 10/08/2017, 1:22 PM

## 2017-10-08 NOTE — Progress Notes (Signed)
Occupational Therapy Treatment Patient Details Name: Becky Douglas MRN: [id number] DOB: 1946/02/13 Today's Date: 10/08/2017    History of present illness Pt s/p R THR and with hx of CAD, COPD and CABG.   OT comments  Pt with change in cognition. Disoriented to place, time and demonstrating poor memory and ability to problem solve. Pt requires 4L 02 to maintain sats of 91-94%, she is typically on 2L at home. Performed incentive spirometer exercises. Pt is progressing slowly. Recommending SNF for ST rehab prior to return home based on today's performance.  Follow Up Recommendations  SNF;Supervision/Assistance - 24 hour    Equipment Recommendations  None recommended by OT    Recommendations for Other Services      Precautions / Restrictions Precautions Precautions: Fall Precaution Comments: monitor O2 Restrictions Weight Bearing Restrictions: No Other Position/Activity Restrictions: WBAT       Mobility Bed Mobility               General bed mobility comments: pt received in chair  Transfers Overall transfer level: Needs assistance Equipment used: Rolling walker (2 wheeled) Transfers: Sit to/from Stand Sit to Stand: Min assist;+2 safety/equipment         General transfer comment: cues for hand placement, use of momentum, assist to rise, steady and control descent to chair    Balance Overall balance assessment: Needs assistance   Sitting balance-Leahy Scale: Fair       Standing balance-Leahy Scale: Poor                             ADL either performed or assessed with clinical judgement   ADL Overall ADL's : Needs assistance/impaired Eating/Feeding: Set up;Sitting Eating/Feeding Details (indicate cue type and reason): pt attempting to drink coffee without removing lid             Upper Body Dressing : Minimal assistance;Sitting       Toilet Transfer: Minimal assistance;Ambulation;RW Toilet Transfer Details (indicate cue type and  reason): simulated          Functional mobility during ADLs: Minimal assistance;+2 for safety/equipment;Cueing for sequencing;Cueing for safety;Rolling walker General ADL Comments: Practiced use of incentive spirometer x 10.     Vision       Perception     Praxis      Cognition Arousal/Alertness: Awake/alert Behavior During Therapy: WFL for tasks assessed/performed Overall Cognitive Status: Impaired/Different from baseline Area of Impairment: Orientation;Attention;Memory;Following commands;Safety/judgement;Problem solving                 Orientation Level: Disoriented to;Time;Place Current Attention Level: Sustained Memory: Decreased short-term memory Following Commands: Follows one step commands with increased time(needs frequent repetition) Safety/Judgement: Decreased awareness of safety;Decreased awareness of deficits   Problem Solving: Slow processing;Decreased initiation;Difficulty sequencing;Requires verbal cues General Comments: RN is aware of pt's change in cognition        Exercises     Shoulder Instructions       General Comments      Pertinent Vitals/ Pain       Pain Assessment: Faces Faces Pain Scale: Hurts even more Pain Location: R hip Pain Descriptors / Indicators: Aching;Sore Pain Intervention(s): Monitored during session;Repositioned;Premedicated before session  Home Living                                          Prior Functioning/Environment  Frequency  Min 2X/week        Progress Toward Goals  OT Goals(current goals can now be found in the care plan section)  Progress towards OT goals: Not progressing toward goals - comment(pt with change in cognition)  Acute Rehab OT Goals Patient Stated Goal: decrease pain OT Goal Formulation: With patient Time For Goal Achievement: 10/14/17 Potential to Achieve Goals: Good  Plan Discharge plan needs to be updated    Co-evaluation    PT/OT/SLP  Co-Evaluation/Treatment: Yes Reason for Co-Treatment: For patient/therapist safety   OT goals addressed during session: Strengthening/ROM      AM-PAC PT "6 Clicks" Daily Activity     Outcome Measure   Help from another person eating meals?: A Little Help from another person taking care of personal grooming?: A Little Help from another person toileting, which includes using toliet, bedpan, or urinal?: A Lot Help from another person bathing (including washing, rinsing, drying)?: A Lot Help from another person to put on and taking off regular upper body clothing?: A Little Help from another person to put on and taking off regular lower body clothing?: A Lot 6 Click Score: 15    End of Session Equipment Utilized During Treatment: Rolling walker;Oxygen;Gait belt  OT Visit Diagnosis: Unsteadiness on feet (R26.81);Pain;Other symptoms and signs involving cognitive function Pain - Right/Left: Right Pain - part of body: Hip   Activity Tolerance Patient tolerated treatment well   Patient Left in chair;with call bell/phone within reach   Nurse Communication Other (comment)(cognitive change)        Time: [id number] OT Time Calculation (min): 36 min  Charges: OT General Charges $OT Visit: 1 Visit OT Treatments $Self Care/Home Management : 8-22 mins  10/08/2017 Becky Douglas, OTR/L Pager: [phone]   Becky Douglas, Becky Douglas 10/08/2017, 11:54 AM

## 2017-10-08 NOTE — Progress Notes (Signed)
Subjective: 2 Days Post-Op Procedure(s) (LRB): RIGHT TOTAL HIP ARTHROPLASTY ANTERIOR APPROACH (Right) Patient reports pain as mild.  Seems slightly confused this morning.   Objective: Vital signs in last 24 hours: Temp:  [98.1 F (36.7 C)-100 F (37.8 C)] 98.9 F (37.2 C) (11/04 1008) Pulse Rate:  [65-76] 69 (11/04 1009) Resp:  [13-20] 20 (11/04 0640) BP: (100-126)/(37-54) 109/39 (11/04 1009) SpO2:  [90 %-92 %] 92 % (11/04 1011)  Intake/Output from previous day: 11/03 0701 - 11/04 0700 In: 1890 [P.O.:840; I.V.:1050] Out: 1600 [Urine:1600] Intake/Output this shift: Total I/O In: 240 [P.O.:240] Out: -   Recent Labs    10/07/17 0651 10/08/17 0617  HGB 10.4* 10.2*   Recent Labs    10/07/17 0651 10/08/17 0617  WBC 17.7* 16.3*  RBC 3.77* 3.76*  HCT 34.2* 33.9*  PLT 369 331   Recent Labs    10/07/17 0651  NA 140  K 5.3*  CL 104  CO2 28  BUN 20  CREATININE 1.23*  GLUCOSE 107*  CALCIUM 8.5*   No results for input(s): LABPT, INR in the last 72 hours.  Sensation intact distally Intact pulses distally Dorsiflexion/Plantar flexion intact Incision: scant drainage Compartment soft  Assessment/Plan: 2 Days Post-Op Procedure(s) (LRB): RIGHT TOTAL HIP ARTHROPLASTY ANTERIOR APPROACH (Right) Up with therapy  Confusion slight and most likely due to narcotics  WBC trending down  Will hold discharge today for more PT and monitor confusion.   Becky Douglas 10/08/2017, 11:57 AM

## 2017-10-08 NOTE — Progress Notes (Signed)
Physical Therapy Treatment Patient Details Name: Becky Douglas MRN: [id number] DOB: 03/30/1946 Today's Date: 10/08/2017    History of Present Illness Pt s/p R THR and with hx of CAD, COPD and CABG.    PT Comments    Supine therex for 2nd session.  Pt with limited hip/knee flexion and needing AAROM to complete exercises despite cues on how to perform.  o2 89-92% while on 5 L/min.  Encouraged incentive spirometer.  Con't to recommend SNF to maximize her rehab and achieve best results post-op.   Follow Up Recommendations  SNF     Equipment Recommendations  Rolling walker with 5" wheels    Recommendations for Other Services       Precautions / Restrictions Precautions Precautions: Fall Precaution Comments: monitor O2(tend to get quicker reading on L hand) Restrictions Weight Bearing Restrictions: No Other Position/Activity Restrictions: WBAT    Mobility  Bed Mobility                 Transfers            Ambulation/Gait    Stairs   Wheelchair Mobility    Modified Rankin (Stroke Patients Only)       Balance Overall balance assessment: Needs assistance   Sitting balance-Leahy Scale: Fair       Standing balance-Leahy Scale: Poor                              Cognition Arousal/Alertness: Awake/alert Behavior During Therapy: WFL for tasks assessed/performed Overall Cognitive Status: Impaired/Different from baseline Area of Impairment: Orientation;Attention;Memory;Following commands;Safety/judgement;Problem solving                 Orientation Level: Disoriented to;Time;Place Current Attention Level: Sustained Memory: Decreased short-term memory Following Commands: Follows one step commands with increased time(frequent cueing) Safety/Judgement: Decreased awareness of safety;Decreased awareness of deficits   Problem Solving: Slow processing;Decreased initiation;Difficulty sequencing;Requires verbal cues General Comments: RN is  aware of pt's change in cognition.  Pt could not remember things that happened just before.      Exercises Total Joint Exercises Ankle Circles/Pumps: AROM;Both;15 reps;Supine Quad Sets: AROM;Both;10 reps;Supine Heel Slides: AAROM;Right;15 reps;Supine Hip ABduction/ADduction: AAROM;Right;10 reps;Supine    General Comments        Pertinent Vitals/Pain Pain Assessment: 0-10 Pain Score: 0-No pain Faces Pain Scale: Hurts even more Pain Location: R hip Pain Descriptors / Indicators: Aching;Sore Pain Intervention(s): Monitored during session;Premedicated before session;Repositioned    Home Living                      Prior Function            PT Goals (current goals can now be found in the care plan section) Acute Rehab PT Goals Patient Stated Goal: decrease pain PT Goal Formulation: With patient Time For Goal Achievement: 10/11/17 Potential to Achieve Goals: Good Progress towards PT goals: Progressing toward goals    Frequency    7X/week      PT Plan Current plan remains appropriate    Co-evaluation   Reason for Co-Treatment: For patient/therapist safety PT goals addressed during session: Mobility/safety with mobility;Balance;Proper use of DME        AM-PAC PT "6 Clicks" Daily Activity  Outcome Measure  Difficulty turning over in bed (including adjusting bedclothes, sheets and blankets)?: Unable Difficulty moving from lying on back to sitting on the side of the bed? : Unable Difficulty sitting down on and standing  up from a chair with arms (e.g., wheelchair, bedside commode, etc,.)?: Unable Help needed moving to and from a bed to chair (including a wheelchair)?: A Little Help needed walking in hospital room?: A Little Help needed climbing 3-5 steps with a railing? : A Little 6 Click Score: 12    End of Session Equipment Utilized During Treatment: Gait belt;Oxygen Activity Tolerance: Patient tolerated treatment well Patient left: in bed;with call  bell/phone within reach;with bed alarm set Nurse Communication: Mobility status PT Visit Diagnosis: Unsteadiness on feet (R26.81);Difficulty in walking, not elsewhere classified (R26.2)     Time: [id number] PT Time Calculation (min) (ACUTE ONLY): 15 min  Charges:   $Therapeutic Exercise: 8-22 mins                    G Codes:       Eyan Hagood L. Katrinka BlazingSmith, [place] Pager [phone] 10/08/2017    Enzo MontgomeryKaren L Amro Winebarger 10/08/2017, 3:24 PM

## 2017-10-08 NOTE — Plan of Care (Signed)
Pt is still generally weak and is limited in her mobility d/t her poor respiratory status.

## 2017-10-09 LAB — CBC
HCT: 30.1 % — ABNORMAL LOW (ref 36.0–46.0)
HEMOGLOBIN: 9.2 g/dL — AB (ref 12.0–15.0)
MCH: 27.3 pg (ref 26.0–34.0)
MCHC: 30.6 g/dL (ref 30.0–36.0)
MCV: 89.3 fL (ref 78.0–100.0)
Platelets: 281 10*3/uL (ref 150–400)
RBC: 3.37 MIL/uL — ABNORMAL LOW (ref 3.87–5.11)
RDW: 14.4 % (ref 11.5–15.5)
WBC: 13.3 10*3/uL — AB (ref 4.0–10.5)

## 2017-10-09 LAB — GLUCOSE, CAPILLARY
GLUCOSE-CAPILLARY: 89 mg/dL (ref 65–99)
GLUCOSE-CAPILLARY: 81 mg/dL (ref 65–99)

## 2017-10-09 NOTE — [place] FL2 (Signed)
[place] MEDICAID FL2 LEVEL OF CARE SCREENING TOOL     IDENTIFICATION  Patient Name: Becky Douglas Birthdate: 1946-04-03 Sex: female Admission Date (Current Location): 10/06/2017  [facility] and [place] Number:  [employer] and Address:  [facility],  [address] [place], [place] [address]      Provider Number: [id number]  Attending Physician Name and Address:  Kathryne Hitch  Relative Name and Phone Number:       Current Level of Care: SNF Recommended Level of Care: Skilled Nursing Facility Prior Approval Number:    Date Approved/Denied:   PASRR Number:    Discharge Plan: SNF    Current Diagnoses: Patient Active Problem List   Diagnosis Date Noted  . Status post total replacement of right hip 10/06/2017  . Pain in right hip 09/11/2017  . Unilateral primary osteoarthritis, right hip 09/11/2017  . Vitamin D deficiency 08/24/2006  . Morbid obesity (HCC) 08/11/2006  . Hypercholesterolemia 08/11/2006  . Coronary atherosclerosis of unspecified type of vessel, native or graft 08/11/2006  . Type II or unspecified type diabetes mellitus without mention of complication, not stated as uncontrolled 08/11/2006    Orientation RESPIRATION BLADDER Height & Weight     Self, Place  Normal Continent Weight: 217 lb (98.4 kg) Height:  5\' 6"  (167.6 cm)  BEHAVIORAL SYMPTOMS/MOOD NEUROLOGICAL BOWEL NUTRITION STATUS  Verbally abusive   Continent Diet(Carb Modified)  AMBULATORY STATUS COMMUNICATION OF NEEDS Skin   Extensive Assist Verbally (Incision-Left Hip)                       Personal Care Assistance Level of Assistance  Bathing, Feeding, Dressing Bathing Assistance: Limited assistance Feeding assistance: Independent Dressing Assistance: Limited assistance     Functional Limitations Info  Sight, Hearing, Speech Sight Info: Adequate Hearing Info: Adequate Speech Info: Adequate    SPECIAL CARE FACTORS FREQUENCY  PT (By licensed PT), OT (By  licensed OT)     PT Frequency: 5x/week  OT Frequency: 5x/week            Contractures Contractures Info: Not present    Additional Factors Info  Code Status, Allergies, Psychotropic, Insulin Sliding Scale Code Status Info: Fullcode Allergies Info: No Known Allergies Psychotropic Info: Xanax, Gabapentin Insulin Sliding Scale Info: 3x's a day/ Bedtime       Current Medications (10/09/2017):  This is the current hospital active medication list Current Facility-Administered Medications  Medication Dose Route Frequency Provider Last Rate Last Dose  . 0.9 %  sodium chloride infusion   Intravenous Continuous Kathryne Hitch, MD 75 mL/hr at 10/09/17 [phone]    . acetaminophen (TYLENOL) tablet 650 mg  650 mg Oral Q4H PRN Kathryne Hitch, MD   650 mg at 10/09/17 1478   Or  . acetaminophen (TYLENOL) suppository 650 mg  650 mg Rectal Q4H PRN Kathryne Hitch, MD      . ALPRAZolam Prudy Feeler) tablet 1 mg  1 mg Oral QHS Kathryne Hitch, MD   1 mg at 10/08/17 2046  . alum & mag hydroxide-simeth (MAALOX/MYLANTA) 200-200-20 MG/5ML suspension 30 mL  30 mL Oral Q4H PRN Kathryne Hitch, MD      . aspirin chewable tablet 81 mg  81 mg Oral BID Kathryne Hitch, MD   81 mg at 10/08/17 2046  . atenolol (TENORMIN) tablet 50 mg  50 mg Oral Daily Kathryne Hitch, MD   50 mg at 10/08/17 1011  . atorvastatin (LIPITOR) tablet 80  mg  80 mg Oral Daily Kathryne HitchBlackman, Christopher Y, MD   80 mg at 10/08/17 1009  . benazepril (LOTENSIN) tablet 10 mg  10 mg Oral Daily Kathryne HitchBlackman, Christopher Y, MD   10 mg at 10/08/17 1011  . diphenhydrAMINE (BENADRYL) 12.5 MG/5ML elixir 12.5-25 mg  12.5-25 mg Oral Q4H PRN Kathryne HitchBlackman, Christopher Y, MD      . docusate sodium (COLACE) capsule 100 mg  100 mg Oral BID Kathryne HitchBlackman, Christopher Y, MD   100 mg at 10/08/17 2046  . fenofibrate tablet 160 mg  160 mg Oral Daily Kathryne HitchBlackman, Christopher Y, MD   160 mg at 10/08/17 1009  . umeclidinium bromide  (INCRUSE ELLIPTA) 62.5 MCG/INH 1 puff  1 puff Inhalation Daily Kathryne HitchBlackman, Christopher Y, MD   1 puff at 10/08/17 1011   And  . fluticasone furoate-vilanterol (BREO ELLIPTA) 200-25 MCG/INH 1 puff  1 puff Inhalation Daily Kathryne HitchBlackman, Christopher Y, MD   1 puff at 10/08/17 1011  . gabapentin (NEURONTIN) capsule 100 mg  100 mg Oral TID Kathryne HitchBlackman, Christopher Y, MD   100 mg at 10/08/17 2046  . glimepiride (AMARYL) tablet 4 mg  4 mg Oral QAC breakfast Kathryne HitchBlackman, Christopher Y, MD   4 mg at 10/09/17 0751  . HYDROcodone-acetaminophen (NORCO/VICODIN) 5-325 MG per tablet 1-2 tablet  1-2 tablet Oral Q4H PRN Kathryne HitchBlackman, Christopher Y, MD   1 tablet at 10/08/17 0807  . HYDROmorphone (DILAUDID) injection 1 mg  1 mg Intravenous Q2H PRN Kathryne HitchBlackman, Christopher Y, MD   1 mg at 10/07/17 1109  . insulin aspart (novoLOG) injection 0-15 Units  0-15 Units Subcutaneous TID WC Kathryne HitchBlackman, Christopher Y, MD   3 Units at 10/08/17 1731  . insulin aspart (novoLOG) injection 0-5 Units  0-5 Units Subcutaneous QHS Kathryne HitchBlackman, Christopher Y, MD   2 Units at 10/06/17 2155  . menthol-cetylpyridinium (CEPACOL) lozenge 3 mg  1 lozenge Oral PRN Kathryne HitchBlackman, Christopher Y, MD       Or  . phenol (CHLORASEPTIC) mouth spray 1 spray  1 spray Mouth/Throat PRN Kathryne HitchBlackman, Christopher Y, MD      . metFORMIN (GLUCOPHAGE) tablet 1,000 mg  1,000 mg Oral BID WC Kathryne HitchBlackman, Christopher Y, MD   1,000 mg at 10/09/17 0751  . methocarbamol (ROBAXIN) tablet 500 mg  500 mg Oral Q6H PRN Kathryne HitchBlackman, Christopher Y, MD   500 mg at 10/08/17 0940   Or  . methocarbamol (ROBAXIN) 500 mg in dextrose 5 % 50 mL IVPB  500 mg Intravenous Q6H PRN Kathryne HitchBlackman, Christopher Y, MD 110 mL/hr at 10/06/17 1431 500 mg at 10/06/17 1431  . metoCLOPramide (REGLAN) tablet 5-10 mg  5-10 mg Oral Q8H PRN Kathryne HitchBlackman, Christopher Y, MD       Or  . metoCLOPramide (REGLAN) injection 5-10 mg  5-10 mg Intravenous Q8H PRN Kathryne HitchBlackman, Christopher Y, MD      . nicotine (NICODERM CQ - dosed in mg/24 hours) patch 14 mg   14 mg Transdermal Daily Richardean Canallark, Gilbert W, PA-C   14 mg at 10/08/17 1323  . nitroGLYCERIN (NITROSTAT) SL tablet 0.4 mg  0.4 mg Sublingual Q5 min PRN Kathryne HitchBlackman, Christopher Y, MD      . ondansetron [facility](ZOFRAN) tablet 4 mg  4 mg Oral Q6H PRN Kathryne HitchBlackman, Christopher Y, MD       Or  . ondansetron [facility](ZOFRAN) injection 4 mg  4 mg Intravenous Q6H PRN Kathryne HitchBlackman, Christopher Y, MD      . oxyCODONE (Oxy IR/ROXICODONE) immediate release tablet 5-10 mg  5-10 mg Oral Q3H PRN Magnus IvanBlackman,  Vanita Panda, MD   10 mg at 10/08/17 1323  . pantoprazole (PROTONIX) EC tablet 40 mg  40 mg Oral QHS Kathryne Hitch, MD   40 mg at 10/08/17 2046  . PARoxetine (PAXIL) tablet 10 mg  10 mg Oral Daily Kathryne Hitch, MD   10 mg at 10/08/17 1010  . polyethylene glycol (MIRALAX / GLYCOLAX) packet 17 g  17 g Oral Daily PRN Kathryne Hitch, MD         Discharge Medications: Please see discharge summary for a list of discharge medications.  Relevant Imaging Results:  Relevant Lab Results:   Additional Information ssn:[phone]  Clearance Coots, LCSW

## 2017-10-09 NOTE — Clinical Social Work Placement (Addendum)
Patient will transport by Car.  Patient will go to Room: 410 Nurse given number for report.  CLINICAL SOCIAL WORK PLACEMENT  NOTE  Date:  10/09/2017  Patient Details  Name: Becky Douglas MRN: [id number] Date of Birth: [birth year]  Clinical Social Work is seeking post-discharge placement for this patient at the Skilled  Nursing Facility level of care (*CSW will initial, date and re-position this form in  chart as items are completed):  Yes   Patient/family provided with [facility] Clinical Social Work Department's list of facilities offering this level of care within the geographic area requested by the patient (or if unable, by the patient's family).  Yes   Patient/family informed of their freedom to choose among providers that offer the needed level of care, that participate in Medicare, Medicaid or managed care program needed by the patient, have an available bed and are willing to accept the patient.  Yes   Patient/family informed of [facility]'s ownership interest in [facility] and [facility], as well as of the fact that they are under no obligation to receive care at these facilities.  PASRR submitted to EDS on       PASRR number received on       Existing PASRR number confirmed on (Pt. is [place] Resident/No PASRR needed. )     FL2 transmitted to all facilities in geographic area requested by pt/family on       FL2 transmitted to all facilities within larger geographic area on 10/09/17     Patient informed that his/her managed care company has contracts with or will negotiate with certain facilities, including the following:        Yes   Patient/family informed of bed offers received.  [facility]     Physician recommends and patient [facility]    [facility].  [facility]     Patient family notified on 10/09/17 of transfer.  Name of family member notified:  Daughter-Sarah     PHYSICIAN       Additional Comment:    _______________________________________________ Clearance CootsNicole A Naleigha Raimondi, LCSW 10/09/2017, 2:29 PM

## 2017-10-09 NOTE — Discharge Summary (Signed)
Patient ID: Becky CavaJeanette Douglas MRN: [id number] DOB/AGE: 70/01/1946 70 y.o.  Admit date: 10/06/2017 Discharge date: 10/09/2017  Admission Diagnoses:  Principal Problem:   Unilateral primary osteoarthritis, right hip Active Problems:   Status post total replacement of right hip   Discharge Diagnoses:  Same  Past Medical History:  Diagnosis Date  . Anxiety   . Arthritis   . COPD (chronic obstructive pulmonary disease) (HCC)   . Coronary artery disease   . Depression   . Diabetes mellitus without complication (HCC)    type II   . Dyspnea   . Dysrhythmia    " every now and again out of rhythm "   . GERD (gastroesophageal reflux disease)   . Heart murmur   . Left rotator cuff tear     Surgeries: Procedure(s): RIGHT TOTAL HIP ARTHROPLASTY ANTERIOR APPROACH on 10/06/2017   Consultants:   Discharged Condition: Improved  Hospital Course: Becky Douglas is an 71 y.o. female who was admitted 10/06/2017 for operative treatment ofUnilateral primary osteoarthritis, right hip. Patient has severe unremitting pain that affects sleep, daily activities, and work/hobbies. After pre-op clearance the patient was taken to the operating room on 10/06/2017 and underwent  Procedure(s): RIGHT TOTAL HIP ARTHROPLASTY ANTERIOR APPROACH.    Patient was given perioperative antibiotics:  Anti-infectives (From admission, onward)   Start     Dose/Rate Route Frequency Ordered Stop   10/06/17 1600  ceFAZolin (ANCEF) IVPB 1 g/50 mL premix     1 g 100 mL/hr over 30 Minutes Intravenous Every 6 hours 10/06/17 1429 10/06/17 2150   10/06/17 0721  ceFAZolin (ANCEF) 2-4 GM/100ML-% IVPB    Comments:  Curlene DolphinWhitlow, Cheryl   : cabinet override      10/06/17 0721 10/06/17 1006   10/06/17 0716  ceFAZolin (ANCEF) IVPB 2g/100 mL premix     2 g 200 mL/hr over 30 Minutes Intravenous On call to O.R. 10/06/17 0716 10/06/17 1011       Patient was given sequential compression devices, early ambulation, and chemoprophylaxis to  prevent DVT.  Patient benefited maximally from hospital stay and there were no complications.    Recent vital signs:  Patient Vitals for the past 24 hrs:  BP Temp Temp src Pulse Resp SpO2  10/09/17 0655 (!) 132/51 98.6 F (37 C) Oral 78 18 94 %  10/08/17 2058 (!) 119/54 99.8 F (37.7 C) Oral 79 (!) 21 90 %  10/08/17 1600 93/60 99.2 F (37.3 C) Oral 98 (!) 22 91 %  10/08/17 1011 - - - - - 92 %  10/08/17 1009 (!) 109/39 - - 69 - -  10/08/17 1008 (!) 100/37 98.9 F (37.2 C) Oral - - -     Recent laboratory studies:  Recent Labs    10/07/17 0651 10/08/17 0617 10/09/17 0542  WBC 17.7* 16.3* 13.3*  HGB 10.4* 10.2* 9.2*  HCT 34.2* 33.9* 30.1*  PLT 369 331 281  NA 140  --   --   K 5.3*  --   --   CL 104  --   --   CO2 28  --   --   BUN 20  --   --   CREATININE 1.23*  --   --   GLUCOSE 107*  --   --   CALCIUM 8.5*  --   --      Discharge Medications:   Allergies as of 10/09/2017   No Known Allergies     Medication List    STOP taking these medications  aspirin EC 81 MG tablet Replaced by:  aspirin 81 MG chewable tablet     TAKE these medications   ALPRAZolam 1 MG tablet Commonly known as:  XANAX Take 1 mg by mouth at bedtime.   aspirin 81 MG chewable tablet Chew 1 tablet (81 mg total) by mouth 2 (two) times daily. Replaces:  aspirin EC 81 MG tablet   atenolol 50 MG tablet Commonly known as:  TENORMIN Take 50 mg by mouth daily.   atorvastatin 80 MG tablet Commonly known as:  LIPITOR Take 80 mg by mouth daily.   benazepril 10 MG tablet Commonly known as:  LOTENSIN Take 10 mg by mouth daily.   fenofibrate 160 MG tablet Take 160 mg by mouth daily.   glimepiride 4 MG tablet Commonly known as:  AMARYL Take 4 mg by mouth daily.   metFORMIN 1000 MG tablet Commonly known as:  GLUCOPHAGE Take 1,000 mg by mouth 2 (two) times daily.   methocarbamol 500 MG tablet Commonly known as:  ROBAXIN Take 1 tablet (500 mg total) by mouth every 6 (six) hours as  needed for muscle spasms.   nitroGLYCERIN 0.4 MG SL tablet Commonly known as:  NITROSTAT Place 0.4 mg under the tongue every 5 (five) minutes as needed for chest pain.   oxyCODONE-acetaminophen 5-325 MG tablet Commonly known as:  ROXICET Take 1-2 tablets by mouth every 4 (four) hours as needed for severe pain.   pantoprazole 40 MG tablet Commonly known as:  PROTONIX Take 40 mg by mouth at bedtime.   PARoxetine 10 MG tablet Commonly known as:  PAXIL Take 10 mg by mouth daily.   TRELEGY ELLIPTA 100-62.5-25 MCG/INH Aepb Generic drug:  Fluticasone-Umeclidin-Vilant Inhale 1 puff into the lungs daily.            Durable Medical Equipment  (From admission, onward)        Start     Ordered   10/07/17 1022  For home use only DME Walker rolling  Once    Question:  Patient needs a walker to treat with the following condition  Answer:  Immobility   10/07/17 1022   10/06/17 1430  DME 3 n 1  Once     10/06/17 1429   10/06/17 1430  DME Walker rolling  Once    Question:  Patient needs a walker to treat with the following condition  Answer:  Status post total replacement of right hip   10/06/17 1429      Diagnostic Studies: Dg Pelvis Portable  Result Date: 10/06/2017 CLINICAL DATA:  Status post total replacement of right hip EXAM: PORTABLE PELVIS 1-2 VIEWS COMPARISON:  Operative images obtained today. FINDINGS: Single view demonstrates the new right total hip arthroplasty. The components appear well seated and well aligned. No evidence of an acute fracture or operative complication. IMPRESSION: 1. Well-positioned right total hip arthroplasty. Electronically Signed   By: Amie Portland M.D.   On: 10/06/2017 12:35   Dg C-arm 1-60 Min-no Report  Result Date: 10/06/2017 Fluoroscopy was utilized by the requesting physician.  No radiographic interpretation.   Dg Hip Operative Unilat With Pelvis Right  Result Date: 10/06/2017 CLINICAL DATA:  71 year old female with a history of right hip  replacement anterior approach EXAM: OPERATIVE RIGHT HIP (WITH PELVIS IF PERFORMED)  VIEWS TECHNIQUE: Fluoroscopic spot image(s) were submitted for interpretation post-operatively. COMPARISON:  09/11/2017 FINDINGS: Intraoperative fluoroscopic spot images of right hip arthroplasty demonstrating gas within the soft tissues at alignment of the acetabular and femoral component.  No immediate complicating feature. IMPRESSION: Limited fluoroscopic spot images of right hip arthroplasty without immediate complicating feature. Please refer to the dictated operative report for full details of intraoperative findings and procedure. Electronically Signed   By: Gilmer Mor D.O.   On: 10/06/2017 11:56   Xr Hip Unilat W Or W/o Pelvis 1v Right  Result Date: 09/11/2017 An AP pelvis and lateral of her right hip show severe end-stage arthritis. There is complete loss of the joint space of the right hip with periarticular osteophytes, sclerotic changes and cystic changes in the femoral head.   Disposition: to skilled nursing facility  Discharge Instructions    Discharge patient   Complete by:  As directed    Discharge disposition:  03-Skilled Nursing Facility   Discharge patient date:  10/09/2017      Follow-up Information    Kathryne Hitch, MD. Schedule an appointment as soon as possible for a visit in 2 week(s).   Specialty:  Orthopedic Surgery Contact information: [address] [place] [address] [phone]        [facility] - Dme Follow up.   Why:  RW has been ordered and will be delivered to your hospital room prior to discharge.  Contact information: [address] [place] [address] [phone]            Signed: Kathryne Hitch 10/09/2017, 7:07 AM

## 2017-10-09 NOTE — Op Note (Signed)
NAME:  Becky Douglas, Becky Douglas                    ACCOUNT NO.:  MEDICAL RECORD NO.:  [id number]  LOCATION:                                 FACILITY:  PHYSICIAN:  Vanita PandaChristopher Y. Magnus IvanBlackman, M.D.DATE OF BIRTH:  DATE OF PROCEDURE:  10/06/2017 DATE OF DISCHARGE:                              OPERATIVE REPORT   PREOPERATIVE DIAGNOSIS:  Primary osteoarthritis and degenerative joint disease, right hip.  POSTOPERATIVE DIAGNOSIS:  Primary osteoarthritis and degenerative joint disease, right hip.  PROCEDURE:  Right total hip arthroplasty through direct anterior approach.  IMPLANTS:  DePuy Sector Gription acetabular component size 52, size 36 +0 polyethylene liner, size 12 Corail femoral component with standard offset, size 36+ 5 ceramic hip ball.  SURGEON:  Vanita PandaChristopher Y. Magnus IvanBlackman, M.D.  ASSISTANT:  Richardean CanalGilbert Clark, PA-C.  ANESTHESIA:  Spinal.  ANTIBIOTICS:  2 g of IV Ancef.  BLOOD LOSS:  200 mL.  COMPLICATIONS:  None.  INDICATIONS:  [name] is a very pleasant [age] year old female with debilitating arthritis involving her right hip.  This was well documented with x-rays, showing complete loss of right joint space.  She has tried and failed all forms of conservative treatment.  She is significantly obese as well.  She has tried activity modification, weight loss, walking with assisted device, anti-inflammatories and steroid injections.  At this point, with failure of conservative treatment, she does wish to proceed with a total hip arthroplasty through direct anterior approach.  We talked in detail about the risk of acute blood loss anemia, nerve and vessel injury, fracture, infection, dislocation, DVT.  We also talked about wound complications given her obesity.  She understands our goals are to decrease pain, improve mobility and overall improved quality of life.  PROCEDURE DESCRIPTION:  After informed consent was obtained, appropriate right hip was marked.  She was brought to the  operating room where spinal anesthesia was obtained while she was on her stretcher.  A Foley catheter was placed and both feet had traction boots applied to them. Next, she was placed supine on the Hana fracture table with the perineal post in place and both legs in inline skeletal traction devices, but no traction applied.  Her right operative hip was prepped and draped with DuraPrep and sterile drapes.  A time-out was called.  She was identified as correct patient and correct right hip.  We then made an incision just inferior and posterior to the anterior superior iliac spine and carried this obliquely down the leg.  We dissected down the tensor fascia lata muscle.  The tensor fascia was then divided longitudinally to proceed with a direct anterior approach to the hip.  We identified and cauterized the circumflex vessels and then identified the hip capsule. I opened up the hip capsule in an L-type format, finding significant effusion and significant arthritis throughout her right hip.  We placed Cobra retractors around the medial and lateral femoral neck and then made our femoral neck cut just above the calcar and the lesser trochanter.  We completed this with an osteotome after making with an oscillating saw.  I placed a corkscrew guide in the femoral head and found the femoral head to be  completely devoid of cartilage.  We then cleaned the acetabulum, remnants of the acetabular labrum and other debris.  I placed a bent Hohmann across the medial acetabular rim and then cleaned remnants of the acetabular labrum and other debris.  I then began reaming under direct visualization from a size 42 reamer in stepwise increments going up to size 52 with all reamers under direct visualization, the last reamer under direct fluoroscopy, so I could obtain my depth of reaming, my inclination and anteversion.  Once I was pleased with that, I placed the real DePuy Sector Gription acetabular component  size 52 and a 36+ 0 neutral polyethylene liner for that size acetabular component.  Attention was then turned to the femur.  With the leg externally rotated to 120 degrees, extended and adducted, I was able to place a Mueller retractor medially and a Hohmann retractor behind the greater trochanter.  I released the lateral joint capsule and used a box cutting osteotome to enter the femoral canal and a rongeur to lateralize.  I then began broaching from a size 8 broach using the Corail broaching system going up to size 13.  With the size 13 in place, we trialed a standard offset femoral neck and a 31+ 5 hip ball.  We brought the leg back over and up with traction and internal rotation reducing the pelvis, and I felt like we needed more leg length and offset, although it was stable on range of motion.  We dislocated the hip and removed the trial components.  I was able to place the real Corail femoral component size 12 with standard offset, and then we went with a real 36+ 5 ceramic hip ball.  We reduced this in the acetabulum and again, I was definitely pleased with stability, leg length, offset and range of motion.  We then irrigated the soft tissue with normal saline solution using pulsatile lavage.  I was able to close the joint capsule with interrupted #1 Ethibond suture followed by running #1 Vicryl in the tensor fascia, 0 Vicryl in the deep tissue, 2-0 Vicryl in the subcutaneous tissue, interrupted staples on the skin.  Xeroform and Aquacel dressing were applied.  She was then taken off the Hana table, taken to the recovery room in stable condition.  All final counts were correct.  There were no complications noted.  Of note, Richardean Canal, PA- C assisted in the entire case and his assistance was crucial for facilitating all aspects of this case.     Vanita Panda. Magnus Ivan, M.D.     CYB/MEDQ  D:  10/06/2017  T:  10/07/2017  Job:  [id number]

## 2017-10-09 NOTE — Clinical Social Work Note (Addendum)
Clinical Social Work Assessment  Patient Details  Name: Becky Douglas MRN: [id number] Date of Birth: 1946-05-24  Date of referral:  10/09/17               Reason for consult:  Facility Placement                Permission sought to share information with:  Family Supports Permission granted to share information::     Name::        Agency::  SNF  Relationship::  Daughter   Contact Information:     Housing/Transportation Living arrangements for the past 2 months:  Single Family Home Source of Information:  Adult Children Patient Interpreter Needed:  None Criminal Activity/Legal Involvement Pertinent to Current Situation/Hospitalization:  No - Comment as needed Significant Relationships:  Adult Children Lives with:  Self Do you feel safe going back to the place where you live?  Yes Need for family participation in patient care:  Yes   Care giving concerns:  Patient is now requiring SNF level of care. Patient and family live in [place] and prefers a facility in [place].    Social Worker assessment / plan:  CSW discussed discharge plan with patient and daughter. She reports the patient lives home alone and cannot return home without support. She reports the family was ready to care for patient at home but learn the patient was not progressing and SNF was recommended.  The family prefers SNF in [place]. CSW explain EMS transportation cost to transported, pt. Daughter reports she will pick patient up because they are unable to pay for EMS transportation.  CSW called daughters requested facilities/waiting for return call.   Patient is refusing SNF/ Daughter is discussing safe d/c plan with patient.   Plan: Assist with discharge to SNF, if patient agreeable.   Employment status:  Retired Health and safety inspectornsurance information:  Medicare PT Recommendations:  Skilled Nursing Facility Information / Referral to community resources:  Skilled Nursing Facility  Patient/Family's Response to care:  Patient  daughter very involved in patient care, she request patient be given tylenol or Advil for pain, no narcotics.   Patient/Family's Understanding of and Emotional Response to Diagnosis, Current Treatment, and Prognosis: "I cannot be at home with her 24 hours a day, so I think her going to SNF for a few weeks will really help her to improve faster."  Emotional Assessment Appearance:  Appears stated age Attitude/Demeanor/Rapport:    Affect (typically observed):    Orientation:  Oriented to Self, Oriented to Place Alcohol / Substance use:  Not Applicable Psych involvement (Current and /or in the community):  No (Comment)  Discharge Needs  Concerns to be addressed:  Discharge Planning Concerns Readmission within the last 30 days:  No Current discharge risk:  None Barriers to Discharge:  No SNF bed   Clearance CootsNicole A Jlon Betker, LCSW 10/09/2017, 9:45 AM

## 2017-10-09 NOTE — Progress Notes (Signed)
Patient pulled out IV. She is verbally abusive and confused about the situation. Will continue to monitor patient.

## 2017-10-09 NOTE — Progress Notes (Addendum)
Patient is stable at discharge to facility. Attempted to call report at [facility]. Left voice mail to call back nursing staff. Patient's Family is picking patient up for transport. Nurse from SNF called to receive report at [phone]. Report was given.

## 2017-10-09 NOTE — Progress Notes (Signed)
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    PT Comments    POD # 3 am session Pt OOB in recliner AxOx4.  Pt on 4 lts nasal at 99%.  Pt stated she has been on oxygen "for years" 2 lts.  Assisted with amb on 2 lts avg sats 92% and HR avg 109.  Tolerated an increased distance however required increased time and instruction on proper walker use/distance/safety.     Follow Up Recommendations  SNF     Equipment Recommendations  Rolling walker with 5" wheels    Recommendations for Other Services       Precautions / Restrictions Precautions Precautions: Fall Precaution Comments: home O2 2 lts Restrictions Weight Bearing Restrictions: No Other Position/Activity Restrictions: WBAT    Mobility  Bed Mobility               General bed mobility comments: OOB in recliner   Transfers Overall transfer level: Needs assistance Equipment used: Rolling walker (2 wheeled) Transfers: Sit to/from [employer];Stand Pivot Transfers Sit to Stand: Min guard;Min assist Stand pivot transfers: Min guard;Min assist       General transfer comment: 50% VC's on safety with turns and 25% VC's on proper hand placement esp with stand to sit as pt sat uncontrolled.    Ambulation/Gait Ambulation/Gait assistance: Min guard;Min assist Ambulation Distance (Feet): 55 Feet Assistive device: Rolling walker (2 wheeled) Gait Pattern/deviations: Decreased step length - right;Decreased step length - left;Trunk flexed Gait velocity: decreased x 2   General Gait Details: 25% cues for proper sequence and proper walker to self distance.  Also required increased, increased time.  Nearly 12 min to complete distance.     Stairs            Wheelchair Mobility    Modified Rankin (Stroke Patients Only)       Balance                                            Cognition Arousal/Alertness: Awake/alert Behavior During Therapy: WFL for tasks assessed/performed Overall Cognitive Status: Within Functional Limits for tasks assessed                                 General Comments: cognition improved      Exercises      General Comments        Pertinent Vitals/Pain Pain Assessment: 0-10 Pain Score: 4  Pain Location: R hip Pain Descriptors / Indicators: Aching;Sore;Operative site guarding Pain Intervention(s): Monitored during session;Premedicated before session;Repositioned;Ice applied    Home Living                      Prior Function            PT Goals (current goals can now be found in the care plan section) Progress towards PT goals: Progressing toward goals    Frequency    7X/week      PT Plan Current plan remains appropriate    Co-evaluation              AM-PAC PT "6 Clicks" Daily Activity  Outcome Measure  Difficulty turning over  in bed (including adjusting bedclothes, sheets and blankets)?: Unable Difficulty moving from lying on back to sitting on the side of the bed? : Unable Difficulty sitting down on and standing up from a chair with arms (e.g., wheelchair, bedside commode, etc,.)?: Unable Help needed moving to and from a bed to chair (including a wheelchair)?: A Little Help needed walking in hospital room?: A Little Help needed climbing 3-5 steps with a railing? : A Little 6 Click Score: 12    End of Session Equipment Utilized During Treatment: Gait belt;Oxygen Activity Tolerance: Patient tolerated treatment well Patient left: in chair;with call bell/phone within reach Nurse Communication: Mobility status PT Visit Diagnosis: Unsteadiness on feet (R26.81);Difficulty in walking, not elsewhere classified (R26.2)     Time: 1610-9604 PT Time Calculation (min) (ACUTE ONLY): 27 min  Charges:  $Gait Training: 8-22 mins $Therapeutic Activity: 8-22  mins                    G Codes:       Felecia Shelling  PTA [facility]  Acute  Rehab Pager      [phone]

## 2017-10-09 NOTE — Progress Notes (Signed)
Patient ID: Becky CavaJeanette Hurd, female   DOB: October 30, 1946, 71 y.o.   MRN: [id number] No acute changes.  She is on O2 at home chronically.  Her right hip is stable.  Therapy is now recommending short-term skilled nursing instead of home.  Can be discharged today.

## 2017-10-18 ENCOUNTER — Telehealth (INDEPENDENT_AMBULATORY_CARE_PROVIDER_SITE_OTHER): Payer: Self-pay | Admitting: Orthopaedic Surgery

## 2017-10-18 NOTE — Telephone Encounter (Signed)
Patient's daughter Maralyn Sago(Sarah) called advised patient released [facility] and do not have any pain medicine. Sarah asked that the Rx be sent to [facility] in [place]  The ph# is [phone]  The number to contact patient is [phone]

## 2017-10-18 NOTE — Telephone Encounter (Signed)
LMOM for patient daughter letting her know that I can't call in pain medicine for one, BUT she should call facility she was discharged from, they should be the one giving her RX

## 2017-10-23 ENCOUNTER — Encounter (INDEPENDENT_AMBULATORY_CARE_PROVIDER_SITE_OTHER): Payer: Self-pay | Admitting: Orthopaedic Surgery

## 2017-10-23 ENCOUNTER — Ambulatory Visit (INDEPENDENT_AMBULATORY_CARE_PROVIDER_SITE_OTHER): Payer: Medicare Other | Admitting: Orthopaedic Surgery

## 2017-10-23 DIAGNOSIS — Z96641 Presence of right artificial hip joint: Secondary | ICD-10-CM

## 2017-10-23 MED ORDER — GABAPENTIN 100 MG PO CAPS: 100.0000 mg | ORAL_CAPSULE | Freq: Three times a day (TID) | ORAL | 0 refills | Status: DC

## 2017-10-23 MED ORDER — OXYCODONE-ACETAMINOPHEN 5-325 MG PO TABS: 1.0000 | ORAL_TABLET | Freq: Three times a day (TID) | ORAL | 0 refills | Status: DC | PRN

## 2017-10-23 MED ORDER — METHOCARBAMOL 500 MG PO TABS: 500.0000 mg | ORAL_TABLET | Freq: Three times a day (TID) | ORAL | 0 refills | Status: DC | PRN

## 2017-10-23 NOTE — Progress Notes (Signed)
The patient is now 17 days status post a right total hip of the plasty.  She is from [place].  She stated nursing facility only for short amount of time and they did not discharge her on a counter pain medications.  She does have a lot of pain in her hip and her knee on that right operative side she is able with a walker is doing well overall though.  She is back to her 81 mg aspirin once a day.  On exam her ligaments are equal.  Right hip incision looks good staples are removed and Steri-Strips applied.  There is no significant seroma.  At this point I gave her reassurance that she should do well with time but to go slow.  I want to transition from walker to cane when she is comfortable to do so and not go without any assistive device until she is comfortable.  I did refill her oxycodone as well as Robaxin and put on Neurontin due to burning pain she is having this will be 100 mg 3 times a day.  We will see her back in a month and see how she is doing overall.  All questions and concerns were answered and addressed.

## 2017-11-06 IMAGING — DX DG PORTABLE PELVIS
1 series · 1 of 1 positions shown · non-contrast
Comparison: Operative images obtained today.

CLINICAL DATA: Status post total replacement of right hip

EXAM:
PORTABLE PELVIS 1-2 VIEWS

[pelvis ap]
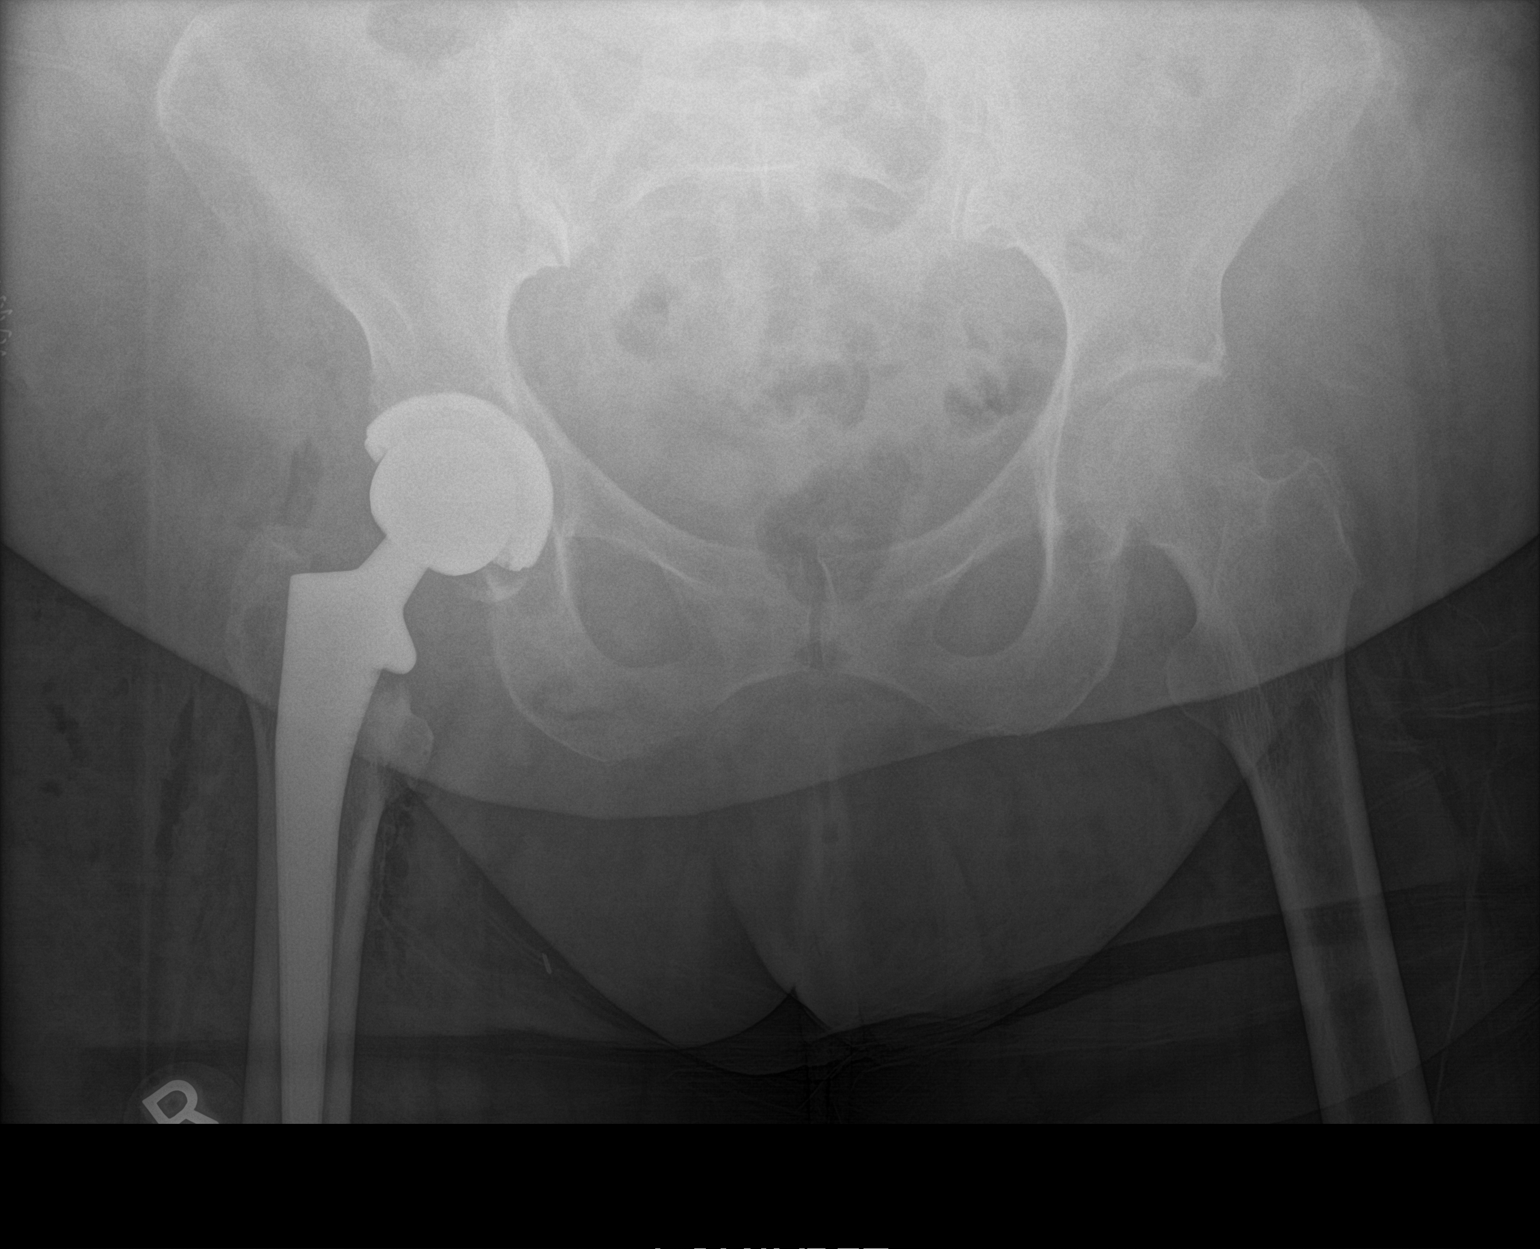

[1 of 1 positions shown; findings below may reference images not displayed]

FINDINGS: Single view demonstrates the new right total hip arthroplasty. The
components appear well seated and well aligned. No evidence of an
acute fracture or operative complication.
IMPRESSION: 1. Well-positioned right total hip arthroplasty.

## 2017-11-20 ENCOUNTER — Ambulatory Visit (INDEPENDENT_AMBULATORY_CARE_PROVIDER_SITE_OTHER): Payer: Medicare Other | Admitting: Orthopaedic Surgery

## 2017-11-20 ENCOUNTER — Encounter (INDEPENDENT_AMBULATORY_CARE_PROVIDER_SITE_OTHER): Payer: Self-pay | Admitting: Orthopaedic Surgery

## 2017-11-20 DIAGNOSIS — Z96641 Presence of right artificial hip joint: Secondary | ICD-10-CM

## 2017-11-20 MED ORDER — METHOCARBAMOL 500 MG PO TABS: 500.0000 mg | ORAL_TABLET | Freq: Three times a day (TID) | ORAL | 0 refills | Status: AC | PRN

## 2017-11-20 MED ORDER — OXYCODONE-ACETAMINOPHEN 5-325 MG PO TABS: 1.0000 | ORAL_TABLET | Freq: Three times a day (TID) | ORAL | 0 refills | Status: AC | PRN

## 2017-11-20 MED ORDER — GABAPENTIN 100 MG PO CAPS: 100.0000 mg | ORAL_CAPSULE | Freq: Three times a day (TID) | ORAL | 0 refills | Status: AC

## 2017-11-20 NOTE — Progress Notes (Signed)
Patient is now about 7 weeks status post a right total hip arthroplasty through direct approach.  She seems to be doing well overall but would like a refill of her gabapentin, methocarbamol, and her oxycodone.  She is walking without assistive device.  She is been going through outpatient physical therapy.  She does report leg swelling.  Her right leg is swollen but the calf is soft.  I recommend she wear compressive garments for this and I gave her a prescription for bilateral knee has with compression of 20-30.  I did refill her oxycodone as well as her methocarbamol and gabapentin.  I can easily put her right hip to internal and external rotation with any difficulty at all.  Incisions well-healed.  At this point I do not need to see her back for 3 months.  I would like a low AP pelvis and lateral of her right hip at that visit.  All questions concerns were answered and addressed.

## 2023-09-05 DEATH — deceased
